# Patient Record
Sex: Male | Born: 1950 | Race: Black or African American | Hispanic: No | Smoking: Current every day smoker
Health system: Southern US, Community
[De-identification: ages and names within clinical notes are randomized; demographics above are authoritative.]

## PROBLEM LIST (undated history)

## (undated) ENCOUNTER — Ambulatory Visit (HOSPITAL_COMMUNITY)

## (undated) DIAGNOSIS — I10 Essential (primary) hypertension: Secondary | ICD-10-CM

## (undated) DIAGNOSIS — R55 Syncope and collapse: Secondary | ICD-10-CM

## (undated) HISTORY — DX: Syncope and collapse: R55

## (undated) HISTORY — DX: Essential (primary) hypertension: I10

---

## 2015-07-29 ENCOUNTER — Ambulatory Visit: Payer: Self-pay | Admitting: Family Medicine

## 2018-04-13 ENCOUNTER — Ambulatory Visit: Payer: Self-pay | Admitting: Family Medicine

## 2019-10-11 ENCOUNTER — Other Ambulatory Visit: Payer: Self-pay

## 2019-10-11 ENCOUNTER — Inpatient Hospital Stay
Admission: EM | Admit: 2019-10-11 | Discharge: 2019-10-13 | DRG: 292 | Disposition: A | Discharge status: Home / Self Care | Payer: No Typology Code available for payment source | Attending: Internal Medicine | Admitting: Internal Medicine

## 2019-10-11 ENCOUNTER — Observation Stay: Payer: No Typology Code available for payment source

## 2019-10-11 ENCOUNTER — Emergency Department: Payer: No Typology Code available for payment source

## 2019-10-11 DIAGNOSIS — I491 Atrial premature depolarization: Secondary | ICD-10-CM | POA: Diagnosis present

## 2019-10-11 DIAGNOSIS — D72829 Elevated white blood cell count, unspecified: Secondary | ICD-10-CM | POA: Diagnosis not present

## 2019-10-11 DIAGNOSIS — R55 Syncope and collapse: Secondary | ICD-10-CM | POA: Diagnosis present

## 2019-10-11 DIAGNOSIS — S0990XA Unspecified injury of head, initial encounter: Secondary | ICD-10-CM | POA: Diagnosis not present

## 2019-10-11 DIAGNOSIS — W1830XA Fall on same level, unspecified, initial encounter: Secondary | ICD-10-CM | POA: Diagnosis present

## 2019-10-11 DIAGNOSIS — R17 Unspecified jaundice: Secondary | ICD-10-CM | POA: Diagnosis not present

## 2019-10-11 DIAGNOSIS — Z794 Long term (current) use of insulin: Secondary | ICD-10-CM

## 2019-10-11 DIAGNOSIS — I440 Atrioventricular block, first degree: Secondary | ICD-10-CM | POA: Diagnosis present

## 2019-10-11 DIAGNOSIS — E876 Hypokalemia: Secondary | ICD-10-CM

## 2019-10-11 DIAGNOSIS — I110 Hypertensive heart disease with heart failure: Secondary | ICD-10-CM | POA: Diagnosis not present

## 2019-10-11 DIAGNOSIS — I5032 Chronic diastolic (congestive) heart failure: Secondary | ICD-10-CM | POA: Diagnosis present

## 2019-10-11 DIAGNOSIS — W19XXXA Unspecified fall, initial encounter: Secondary | ICD-10-CM | POA: Diagnosis present

## 2019-10-11 DIAGNOSIS — N179 Acute kidney failure, unspecified: Secondary | ICD-10-CM | POA: Diagnosis present

## 2019-10-11 DIAGNOSIS — S0101XA Laceration without foreign body of scalp, initial encounter: Secondary | ICD-10-CM | POA: Diagnosis present

## 2019-10-11 DIAGNOSIS — M4802 Spinal stenosis, cervical region: Secondary | ICD-10-CM | POA: Diagnosis present

## 2019-10-11 DIAGNOSIS — Z8249 Family history of ischemic heart disease and other diseases of the circulatory system: Secondary | ICD-10-CM

## 2019-10-11 DIAGNOSIS — Z20822 Contact with and (suspected) exposure to covid-19: Secondary | ICD-10-CM | POA: Diagnosis present

## 2019-10-11 DIAGNOSIS — E1165 Type 2 diabetes mellitus with hyperglycemia: Secondary | ICD-10-CM | POA: Diagnosis present

## 2019-10-11 LAB — COMPREHENSIVE METABOLIC PANEL
ALT: 20 U/L (ref 0–44)
AST: 23 U/L (ref 15–41)
Albumin: 4.5 g/dL (ref 3.5–5.0)
Alkaline Phosphatase: 60 U/L (ref 38–126)
Anion gap: 13 (ref 5–15)
BUN: 21 mg/dL (ref 8–23)
CO2: 26 mmol/L (ref 22–32)
Calcium: 9.4 mg/dL (ref 8.9–10.3)
Chloride: 98 mmol/L (ref 98–111)
Creatinine, Ser: 1.37 mg/dL — ABNORMAL HIGH (ref 0.61–1.24)
GFR calc Af Amer: 60 mL/min — ABNORMAL LOW (ref >60)
GFR calc non Af Amer: 52 mL/min — ABNORMAL LOW (ref >60)
Glucose, Bld: 201 mg/dL — ABNORMAL HIGH (ref 70–99)
Potassium: 3.2 mmol/L — ABNORMAL LOW (ref 3.5–5.1)
Sodium: 137 mmol/L (ref 135–145)
Total Bilirubin: 1.7 mg/dL — ABNORMAL HIGH (ref 0.3–1.2)
Total Protein: 7.7 g/dL (ref 6.5–8.1)

## 2019-10-11 LAB — CBC WITH DIFFERENTIAL/PLATELET
Abs Immature Granulocytes: 0.08 10*3/uL — ABNORMAL HIGH (ref 0.00–0.07)
Basophils Absolute: 0.1 10*3/uL (ref 0.0–0.1)
Basophils Relative: 0 %
Eosinophils Absolute: 0 10*3/uL (ref 0.0–0.5)
Eosinophils Relative: 0 %
HCT: 47.8 % (ref 39.0–52.0)
Hemoglobin: 15.9 g/dL (ref 13.0–17.0)
Immature Granulocytes: 1 %
Lymphocytes Relative: 9 %
Lymphs Abs: 1.3 10*3/uL (ref 0.7–4.0)
MCH: 30.9 pg (ref 26.0–34.0)
MCHC: 33.3 g/dL (ref 30.0–36.0)
MCV: 93 fL (ref 80.0–100.0)
Monocytes Absolute: 1.2 10*3/uL — ABNORMAL HIGH (ref 0.1–1.0)
Monocytes Relative: 8 %
Neutro Abs: 12.7 10*3/uL — ABNORMAL HIGH (ref 1.7–7.7)
Neutrophils Relative %: 82 %
Platelets: 298 10*3/uL (ref 150–400)
RBC: 5.14 MIL/uL (ref 4.22–5.81)
RDW: 14.1 % (ref 11.5–15.5)
WBC: 15.3 10*3/uL — ABNORMAL HIGH (ref 4.0–10.5)
nRBC: 0 % (ref 0.0–0.2)

## 2019-10-11 LAB — TROPONIN I (HIGH SENSITIVITY): Troponin I (High Sensitivity): 24 ng/L — ABNORMAL HIGH (ref <18)

## 2019-10-11 LAB — RESPIRATORY PANEL BY RT PCR (FLU A&B, COVID)
Influenza A by PCR: NEGATIVE
Influenza B by PCR: NEGATIVE
SARS Coronavirus 2 by RT PCR: NEGATIVE

## 2019-10-11 MED ORDER — ENOXAPARIN SODIUM 40 MG/0.4ML ~~LOC~~ SOLN
40.0000 mg | SUBCUTANEOUS | Status: DC
  Administered 2019-10-11 – 2019-10-12 (×2): 40 mg via SUBCUTANEOUS
  Filled 2019-10-11 (×2): qty 0.4

## 2019-10-11 MED ORDER — MAGNESIUM HYDROXIDE 400 MG/5ML PO SUSP: 30.0000 mL | Freq: Every day | ORAL | Status: DC | PRN

## 2019-10-11 MED ORDER — TRAZODONE HCL 50 MG PO TABS
25.0000 mg | ORAL_TABLET | Freq: Every evening | ORAL | Status: DC | PRN
  Administered 2019-10-12: 25 mg via ORAL
  Filled 2019-10-11: qty 1

## 2019-10-11 MED ORDER — POTASSIUM CHLORIDE 20 MEQ PO PACK
40.0000 meq | PACK | Freq: Once | ORAL | Status: AC
  Administered 2019-10-12: 40 meq via ORAL
  Filled 2019-10-11: qty 2

## 2019-10-11 MED ORDER — ACETAMINOPHEN 650 MG RE SUPP: 650.0000 mg | Freq: Four times a day (QID) | RECTAL | Status: DC | PRN

## 2019-10-11 MED ORDER — ONDANSETRON HCL 4 MG PO TABS: 4.0000 mg | ORAL_TABLET | Freq: Four times a day (QID) | ORAL | Status: DC | PRN

## 2019-10-11 MED ORDER — SODIUM CHLORIDE 0.9% FLUSH
3.0000 mL | Freq: Two times a day (BID) | INTRAVENOUS | Status: DC
  Administered 2019-10-12 – 2019-10-13 (×3): 3 mL via INTRAVENOUS

## 2019-10-11 MED ORDER — LIDOCAINE HCL (PF) 1 % IJ SOLN
INTRAMUSCULAR | Status: AC
  Administered 2019-10-11: 5 mL via INTRADERMAL
  Filled 2019-10-11: qty 5

## 2019-10-11 MED ORDER — LIDOCAINE HCL (PF) 1 % IJ SOLN: 5.0000 mL | Freq: Once | INTRAMUSCULAR | Status: AC

## 2019-10-11 MED ORDER — ALUM & MAG HYDROXIDE-SIMETH 200-200-20 MG/5ML PO SUSP: 30.0000 mL | Freq: Four times a day (QID) | ORAL | Status: DC | PRN

## 2019-10-11 MED ORDER — ONDANSETRON HCL 4 MG/2ML IJ SOLN: 4.0000 mg | Freq: Four times a day (QID) | INTRAMUSCULAR | Status: DC | PRN

## 2019-10-11 MED ORDER — SODIUM CHLORIDE 0.9 % IV SOLN: INTRAVENOUS | Status: DC

## 2019-10-11 MED ORDER — INSULIN ASPART 100 UNIT/ML ~~LOC~~ SOLN
0.0000 [IU] | Freq: Three times a day (TID) | SUBCUTANEOUS | Status: DC
  Administered 2019-10-12 (×2): 2 [IU] via SUBCUTANEOUS
  Administered 2019-10-12 – 2019-10-13 (×2): 1 [IU] via SUBCUTANEOUS
  Filled 2019-10-11 (×4): qty 1

## 2019-10-11 MED ORDER — ACETAMINOPHEN 325 MG PO TABS
650.0000 mg | ORAL_TABLET | Freq: Four times a day (QID) | ORAL | Status: DC | PRN
  Administered 2019-10-11: 650 mg via ORAL
  Filled 2019-10-11: qty 2

## 2019-10-11 NOTE — ED Notes (Signed)
IV access attempted by 2 RNs, IV team paged at this time

## 2019-10-11 NOTE — ED Notes (Signed)
Pt reports dizziness has been relieved

## 2019-10-11 NOTE — ED Provider Notes (Signed)
[facility] Emergency Department Provider Note ____________________________________________   First MD Initiated Contact with Patient 10/11/19 2040     (approximate)  I have reviewed the triage vital signs and the nursing notes.   HISTORY  Chief Complaint Loss of Consciousness    HPI Juan Shelton is a 69 y.o. 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  He states that he very briefly felt lightheaded, but did not have enough time to sit down.  He hit the back of his head when he fell.  He states that he feels a bit tired but otherwise relatively well.  He had one recurrent episode of near syncope while waiting in the ED, associated with lightheadedness and sweating.  He denies any chest pain, palpitations, shortness of breath, fever, vomiting, or other acute symptoms.  He has had 1 prior episode like this a few years ago.  He has hypertension but denies any recent medication changes.  He has no history of heart problems.  Past Medical History:  Diagnosis Date  . Hypertension   . Syncope     Patient Active Problem List   Diagnosis Date Noted  . Syncope 10/11/2019    Prior to Admission medications   Medication Sig Start Date End Date Taking? Authorizing Provider  insulin glargine (LANTUS) 100 UNIT/ML injection Inject 15 Units into the skin at bedtime.   Yes [provider]    Allergies Patient has no known allergies.  No family history on file.  Social History Social History   Tobacco Use  . Smoking status: Not on file  Substance Use Topics  . Alcohol use: Not on file  . Drug use: Not on file    Review of Systems  Constitutional: No fever. Eyes: No redness. ENT: No sore throat. Cardiovascular: Denies chest pain. Respiratory: Denies shortness of breath. Gastrointestinal: No vomiting or diarrhea.  Genitourinary: Negative for  dysuria.  Musculoskeletal: Negative for back pain. Skin: Negative for rash. Neurological: Negative for headache.   ____________________________________________   PHYSICAL EXAM:  VITAL SIGNS: ED Triage Vitals  Enc Vitals Group     BP 10/11/19 1944 119/61     Pulse Rate 10/11/19 1944 88     Resp 10/11/19 1944 18     Temp 10/11/19 1944 97.9 F (36.6 C)     Temp Source 10/11/19 1944 Oral     SpO2 10/11/19 1944 97 %     Weight 10/11/19 1945 220 lb (99.8 kg)     Height 10/11/19 1945 6\' 4"  (1.93 m)     Head Circumference --      Peak Flow --      Pain Score 10/11/19 1945 5     Pain Loc --      Pain Edu? --      Excl. in GC? --     Constitutional: Alert and oriented.  Relatively well appearing and in no acute distress. Eyes: Conjunctivae are normal.  EOMI. Head: 5 cm superficial laceration to the submental area. Nose: No congestion/rhinnorhea. Mouth/Throat: Mucous membranes are moist.   Neck: Normal range of motion.  No cervical spinal tenderness. Cardiovascular: Normal rate, regular rhythm. Grossly normal heart sounds.  Good peripheral circulation. Respiratory: Normal respiratory effort.  No retractions. Lungs CTAB. Gastrointestinal: No distention.  Musculoskeletal: No lower extremity edema.  Extremities warm and well perfused.  Neurologic:  Normal speech and language.  5/5 motor strength and intact sensation to all extremities.  No pronator drift.  Normal coordination. Skin:  Skin is warm and dry. No rash noted. Psychiatric: Mood and affect are normal. Speech and behavior are normal.  ____________________________________________   LABS (all labs ordered are listed, but only abnormal results are displayed)  Labs Reviewed  CBC WITH DIFFERENTIAL/PLATELET - Abnormal; Notable for the following components:      Result Value   WBC 15.3 (*)    Neutro Abs 12.7 (*)    Monocytes Absolute 1.2 (*)    Abs Immature Granulocytes 0.08 (*)    All other components within normal limits   COMPREHENSIVE METABOLIC PANEL - Abnormal; Notable for the following components:   Potassium 3.2 (*)    Glucose, Bld 201 (*)    Creatinine, Ser 1.37 (*)    Total Bilirubin 1.7 (*)    GFR calc non Af Amer 52 (*)    GFR calc Af Amer 60 (*)    All other components within normal limits  TROPONIN I (HIGH SENSITIVITY) - Abnormal; Notable for the following components:   Troponin I (High Sensitivity) 24 (*)    All other components within normal limits  RESPIRATORY PANEL BY RT PCR (FLU A&B, COVID)  URINALYSIS, COMPLETE (UACMP) WITH MICROSCOPIC  BASIC METABOLIC PANEL  CBC  TROPONIN I (HIGH SENSITIVITY)   ____________________________________________  EKG  ED ECG REPORT I, Dionne Bucy, the attending physician, personally viewed and interpreted this ECG.  Date: 10/11/2019 EKG Time: 1941 Rate: 84 Rhythm: normal sinus rhythm with PVCs QRS Axis: normal Intervals: Prolonged PR interval, borderline QT ST/T Wave abnormalities: normal Narrative Interpretation: no evidence of acute ischemia  ____________________________________________  RADIOLOGY  CT head: No ICH  ____________________________________________   PROCEDURES  Procedure(s) performed: Yes  .Marland KitchenLaceration Repair  Date/Time: 10/11/2019 9:55 PM Performed by: Dionne Bucy, MD Authorized by: Dionne Bucy, MD   Consent:    Consent obtained:  Verbal   Consent given by:  Patient   Risks discussed:  Infection, pain, retained foreign body, poor cosmetic result and poor wound healing Anesthesia (see MAR for exact dosages):    Anesthesia method:  Local infiltration   Local anesthetic:  Lidocaine 1% w/o epi Laceration details:    Location:  Scalp   Scalp location:  Occipital   Length (cm):  5 Repair type:    Repair type:  Simple Exploration:    Hemostasis achieved with:  Direct pressure   Wound exploration: entire depth of wound probed and visualized     Contaminated: no   Treatment:    Area cleansed  with:  Betadine   Amount of cleaning:  Standard   Visualized foreign bodies/material removed: no   Skin repair:    Repair method:  Staples   Number of staples:  11 Approximation:    Approximation:  Close Post-procedure details:    Dressing:  Open (no dressing)   Patient tolerance of procedure:  Tolerated well, no immediate complications    Critical Care performed: No ____________________________________________   INITIAL IMPRESSION / ASSESSMENT AND PLAN / ED COURSE  Pertinent labs & imaging results that were available during my care of the patient were reviewed by me and considered in my medical decision making (see chart for details).  69 year old male with a history of hypertension and other PMH as noted above presents with an episode of syncope with a brief prodrome earlier this evening.  He hit his head and sustained an occipital laceration.  I reviewed the past medical  records in epic; the patient has no prior ED visits or admissions here.  On exam he is overall well-appearing.  His vital signs are normal.  Neurologic exam is nonfocal.  Exam is otherwise as described above.  EKG shows some PVCs and slightly prolonged PR interval.  There are no ischemic findings.  Triage RN reported that the patient had a near syncopal episode while waiting and during that time was noted to have bradycardia, with his heart rate at one point as low as the 20s.  During my exam, the heart rate was in the 60s to 70s.  Differential includes vasovagal syncope or other benign etiology, versus possible symptomatic bradycardia, other arrhythmia, or less likely ACS or other cardiac etiology.  We will obtain lab work-up, CT head, perform laceration repair, and reassess.  I recommend admission given the potential symptomatic bradycardia with syncope.  ----------------------------------------- 10:20 PM on 10/11/2019 -----------------------------------------  I discussed the case with Dr. Arville Care from the  hospitalist service for admission.  ____________________________________________   FINAL CLINICAL IMPRESSION(S) / ED DIAGNOSES  Final diagnoses:  Syncope, unspecified syncope type      NEW MEDICATIONS STARTED DURING THIS VISIT:  New Prescriptions   No medications on file     Note:  This document was prepared using Dragon voice recognition software and may include unintentional dictation errors.    Dionne Bucy, MD 10/11/19 2221

## 2019-10-11 NOTE — ED Notes (Signed)
First Nurse Note: Pt to ED via [facility]

## 2019-10-11 NOTE — ED Notes (Signed)
Pt had a fall at home with positive LOC. Pt is having nausea and dizziness. Pt is not on blood thinners.  Pt given Zofran 4 mg IM

## 2019-10-11 NOTE — H&P (Addendum)
[place]   PATIENT NAME: Juan Shelton    MR#:  [id number]  DATE OF BIRTH:  1950-05-07  DATE OF ADMISSION:  10/11/2019  PRIMARY CARE PHYSICIAN: Patient, No Pcp Per   REQUESTING/REFERRING PHYSICIAN: Dionne Bucy, MD  CHIEF COMPLAINT:   Chief Complaint  Patient presents with  . Loss of Consciousness    HISTORY OF PRESENT ILLNESS:  Juan Shelton  is a 69 y.o. male with a known history of syncope and hypertension and type II diabetes mellitus presented to the emergency room with acute onset of syncope.  He stated that he had lightheadedness and a fall before was able to sit hitting his occipital area and sustaining a hematoma and laceration.  In the waiting room he had another episode however of lightheadedness with diaphoresis and presyncope without loss of consciousness.  He was then noted to be bradycardic during this presyncope with a heart rate that reportedly went down to the 20s.  He had a similar episode of syncope few years ago.  He denied any chest pain or dyspnea or palpitations.  No cough or wheezing or hemoptysis.  No fever or chills.  No nausea or vomiting or abdominal pain.  Upon presentation to the emergency room, vital signs were within normal.  Labs revealed hypokalemia of 3.2 and hyperglycemia of 2 1 with creatinine 1.37 total bili 1.7 and otherwise normal LFTs.  CBC showed leukocytosis of 15.3 with neutrophilia.  Initial troponin I was 24.  Respiratory panel is currently pending.  Head CT without contrast revealed signs of atrophy and laceration and small hematoma in the right paramedian occipital area with overlying dressing.  EKG showed sinus rhythm with a rate of 84 with first-degree AV block and premature supraventricular complexes with nonspecific T wave changes anterolaterally and inferiorly and prolonged QT interval with QTC of 470 MS.  The patient will be admitted to an observation medical monitored bed for further evaluation and management. PAST MEDICAL  HISTORY:   Past Medical History:  Diagnosis Date  . Hypertension   . Syncope   Type 2 diabetes mellitus Tobacco abuse PAST SURGICAL HISTORY:  He denied any previous surgeries.  SOCIAL HISTORY:   Social History   Tobacco Use  . Smoking status: Not on file  Substance Use Topics  . Alcohol use: Not on file    FAMILY HISTORY:  His father died from MI.  DRUG ALLERGIES:  No Known Allergies  REVIEW OF SYSTEMS:   ROS As per history of present illness. All pertinent systems were reviewed above. Constitutional, HEENT, cardiovascular, respiratory, GI, GU, musculoskeletal, neuro, psychiatric, endocrine, integumentary and hematologic systems were reviewed and are otherwise negative/unremarkable except for positive findings mentioned above in the HPI.   MEDICATIONS AT HOME:   Prior to Admission medications   Not on File      VITAL SIGNS:  Blood pressure 130/76, pulse 76, temperature 97.9 F (36.6 C), temperature source Oral, resp. rate 18, height 6\' 4"  (1.93 m), weight 99.8 kg, SpO2 97 %.  PHYSICAL EXAMINATION:  Physical Exam  GENERAL:  69 y.o.-year-old African-American male patient lying in the bed with no acute distress.  EYES: Pupils equal, round, reactive to light and accommodation. No scleral icterus. Extraocular muscles intact.  HEENT: Head with occipital laceration and intact 11 staples with good hemostasis and surrounding hematoma.  Normocephalic. Oropharynx and nasopharynx clear.  NECK:  Supple, no jugular venous distention. No thyroid enlargement, no tenderness.  LUNGS: Normal breath sounds bilaterally, no wheezing, rales,rhonchi  or crepitation. No use of accessory muscles of respiration.  CARDIOVASCULAR: Regular rate and rhythm, S1, S2 normal. No murmurs, rubs, or gallops.  ABDOMEN: Soft, nondistended, nontender. Bowel sounds present. No organomegaly or mass.  EXTREMITIES: No pedal edema, cyanosis, or clubbing.  NEUROLOGIC: Cranial nerves II through XII are intact.  Muscle strength 5/5 in all extremities. Sensation intact. Gait not checked.  PSYCHIATRIC: The patient is alert and oriented x 3.  Normal affect and good eye contact. SKIN: No obvious rash, lesion, or ulcer.   LABORATORY PANEL:   CBC Recent Labs  Lab 10/11/19 1958  WBC 15.3*  HGB 15.9  HCT 47.8  PLT 298   ------------------------------------------------------------------------------------------------------------------  Chemistries  Recent Labs  Lab 10/11/19 1958  NA 137  K 3.2*  CL 98  CO2 26  GLUCOSE 201*  BUN 21  CREATININE 1.37*  CALCIUM 9.4  AST 23  ALT 20  ALKPHOS 60  BILITOT 1.7*   ------------------------------------------------------------------------------------------------------------------  Cardiac Enzymes No results for input(s): TROPONINI in the last 168 hours. ------------------------------------------------------------------------------------------------------------------  RADIOLOGY:  CT Head Wo Contrast  Result Date: 10/11/2019 CLINICAL DATA:  Head trauma, syncope EXAM: CT HEAD WITHOUT CONTRAST TECHNIQUE: Contiguous axial images were obtained from the base of the skull through the vertex without intravenous contrast. COMPARISON:  None FINDINGS: Brain: No evidence of acute infarction, hemorrhage, hydrocephalus, extra-axial collection or mass lesion/mass effect. Signs of atrophy. Vascular: No hyperdense vessel or unexpected calcification. Skull: Normal. Negative for fracture or focal lesion. Sinuses/Orbits: No acute finding. Other: Laceration with small scalp hematoma in the RIGHT paramidline occipital region with overlying dressing material IMPRESSION: 1. No acute intracranial pathology. 2. Signs of atrophy. 3. Laceration with small scalp hematoma in the RIGHT paramidline occipital region with overlying dressing material. Electronically Signed   By: Donzetta Kohut M.D.   On: 10/11/2019 20:15      IMPRESSION AND PLAN:   1.  Syncope with subsequent head  injury and occipital hematoma and laceration. Differential diagnosis would include symptomatic bradycardia, neurally mediated syncope, cardiogenic, other arrhythmias related,  orthostatic hypotension and less likely hypoglycemia.  He has elevated troponin I. -The patient will be admitted to an observation medically monitored bed.   -Will check orthostatics q 12 hours.   -Will obtain a bilateral carotid Doppler and 2D echo. - The patient will be gently hydrated with IV normal saline and monitored for arrhythmias. -We will follow serial troponin Is. -Given his episode of bradycardia and slightly elevated troponin I will obtain cardiology consultation in a.m. -I notified Dr. Welton Flakes about the patient.  2.  Hypokalemia. -Potassium will be replaced and magnesium level will be checked.  3.  Type 2 diabetes mellitus with mild hyperglycemia. -We will obtain hemoglobin A1c and monitor fingerstick glucose measures with supplement coverage with NovoLog.  4.  Leukocytosis.  It could be secondary to stress demargination especially given his injury. -We will obtain urinalysis.  5.  Ongoing tobacco abuse. -I counseled him for smoking cessation and he will receive further counseling here.  6.  DVT prophylaxis. -Subcutaneous Lovenox.   All the records are reviewed and case discussed with ED provider. The plan of care was discussed in details with the patient (and family). I answered all questions. The patient agreed to proceed with the above mentioned plan. Further management will depend upon hospital course.   CODE STATUS: Full code  Status is: Observation  The patient remains OBS appropriate and will d/c before 2 midnights.  Dispo: The patient is from: Home  Anticipated d/c is to: Home              Anticipated d/c date is: 1 day              Patient currently is not medically stable to d/c.    TOTAL TIME TAKING CARE OF THIS PATIENT: 55 minutes.    Hannah Beat M.D on 10/11/2019  at 10:03 PM  [facility]   From 7 PM-7 AM, contact night-coverage [email or web address]  CC: Primary care physician; Patient, No Pcp Per

## 2019-10-11 NOTE — ED Notes (Signed)
Assumed care of pt upon being roomed. C/o dizziness at this time, denies head pain. +LOC with fall onto cement. Lac with bleeding controlled by gauze to posterior inferior head. Wife at bedside. Awaiting provider eval and troponin results at this time. Pt provided with warm blanket. AO x4. Talking in full sentences with regular and unlabored breathing.

## 2019-10-11 NOTE — ED Triage Notes (Signed)
Pt in with co syncope hx of the same. States unsure of cause, no recent illness. PT states he fell against the wall hitting back of head. Lac noted to area with bleeding controlled. No other injuries from fall at this time.

## 2019-10-12 ENCOUNTER — Inpatient Hospital Stay (HOSPITAL_COMMUNITY)
Admit: 2019-10-12 | Discharge: 2019-10-12 | Disposition: A | Discharge status: Home / Self Care | Payer: No Typology Code available for payment source | Attending: Family Medicine | Admitting: Family Medicine

## 2019-10-12 ENCOUNTER — Encounter: Payer: Self-pay | Admitting: Internal Medicine

## 2019-10-12 ENCOUNTER — Observation Stay: Payer: No Typology Code available for payment source

## 2019-10-12 DIAGNOSIS — R55 Syncope and collapse: Secondary | ICD-10-CM | POA: Diagnosis present

## 2019-10-12 DIAGNOSIS — S0101XA Laceration without foreign body of scalp, initial encounter: Secondary | ICD-10-CM | POA: Diagnosis present

## 2019-10-12 DIAGNOSIS — Z8249 Family history of ischemic heart disease and other diseases of the circulatory system: Secondary | ICD-10-CM | POA: Diagnosis not present

## 2019-10-12 DIAGNOSIS — I340 Nonrheumatic mitral (valve) insufficiency: Secondary | ICD-10-CM

## 2019-10-12 DIAGNOSIS — I361 Nonrheumatic tricuspid (valve) insufficiency: Secondary | ICD-10-CM | POA: Diagnosis not present

## 2019-10-12 DIAGNOSIS — I110 Hypertensive heart disease with heart failure: Secondary | ICD-10-CM | POA: Diagnosis present

## 2019-10-12 DIAGNOSIS — M4802 Spinal stenosis, cervical region: Secondary | ICD-10-CM | POA: Diagnosis present

## 2019-10-12 DIAGNOSIS — Z20822 Contact with and (suspected) exposure to covid-19: Secondary | ICD-10-CM | POA: Diagnosis present

## 2019-10-12 DIAGNOSIS — E1165 Type 2 diabetes mellitus with hyperglycemia: Secondary | ICD-10-CM | POA: Diagnosis present

## 2019-10-12 DIAGNOSIS — Z794 Long term (current) use of insulin: Secondary | ICD-10-CM | POA: Diagnosis not present

## 2019-10-12 DIAGNOSIS — N179 Acute kidney failure, unspecified: Secondary | ICD-10-CM | POA: Diagnosis present

## 2019-10-12 DIAGNOSIS — E876 Hypokalemia: Secondary | ICD-10-CM | POA: Diagnosis present

## 2019-10-12 DIAGNOSIS — W1830XA Fall on same level, unspecified, initial encounter: Secondary | ICD-10-CM | POA: Diagnosis present

## 2019-10-12 DIAGNOSIS — R17 Unspecified jaundice: Secondary | ICD-10-CM | POA: Diagnosis not present

## 2019-10-12 DIAGNOSIS — I491 Atrial premature depolarization: Secondary | ICD-10-CM | POA: Diagnosis present

## 2019-10-12 DIAGNOSIS — W19XXXA Unspecified fall, initial encounter: Secondary | ICD-10-CM | POA: Diagnosis present

## 2019-10-12 DIAGNOSIS — I5032 Chronic diastolic (congestive) heart failure: Secondary | ICD-10-CM | POA: Diagnosis present

## 2019-10-12 DIAGNOSIS — I440 Atrioventricular block, first degree: Secondary | ICD-10-CM | POA: Diagnosis present

## 2019-10-12 DIAGNOSIS — D72829 Elevated white blood cell count, unspecified: Secondary | ICD-10-CM | POA: Diagnosis not present

## 2019-10-12 LAB — COMPREHENSIVE METABOLIC PANEL
ALT: 19 U/L (ref 0–44)
AST: 21 U/L (ref 15–41)
Albumin: 4.1 g/dL (ref 3.5–5.0)
Alkaline Phosphatase: 57 U/L (ref 38–126)
Anion gap: 10 (ref 5–15)
BUN: 20 mg/dL (ref 8–23)
CO2: 25 mmol/L (ref 22–32)
Calcium: 8.9 mg/dL (ref 8.9–10.3)
Chloride: 102 mmol/L (ref 98–111)
Creatinine, Ser: 1.04 mg/dL (ref 0.61–1.24)
GFR calc Af Amer: >60 mL/min (ref >60)
GFR calc non Af Amer: >60 mL/min (ref >60)
Glucose, Bld: 147 mg/dL — ABNORMAL HIGH (ref 70–99)
Potassium: 3.8 mmol/L (ref 3.5–5.1)
Sodium: 137 mmol/L (ref 135–145)
Total Bilirubin: 1.9 mg/dL — ABNORMAL HIGH (ref 0.3–1.2)
Total Protein: 7.2 g/dL (ref 6.5–8.1)

## 2019-10-12 LAB — URINALYSIS, COMPLETE (UACMP) WITH MICROSCOPIC
Bacteria, UA: NONE SEEN
Bilirubin Urine: NEGATIVE
Glucose, UA: NEGATIVE mg/dL
Ketones, ur: 20 mg/dL — AB
Nitrite: NEGATIVE
Protein, ur: 30 mg/dL — AB
Specific Gravity, Urine: 1.029 (ref 1.005–1.030)
pH: 5 (ref 5.0–8.0)

## 2019-10-12 LAB — GLUCOSE, CAPILLARY
Glucose-Capillary: 145 mg/dL — ABNORMAL HIGH (ref 70–99)
Glucose-Capillary: 157 mg/dL — ABNORMAL HIGH (ref 70–99)
Glucose-Capillary: 168 mg/dL — ABNORMAL HIGH (ref 70–99)
Glucose-Capillary: 136 mg/dL — ABNORMAL HIGH (ref 70–99)
Glucose-Capillary: 126 mg/dL — ABNORMAL HIGH (ref 70–99)

## 2019-10-12 LAB — TROPONIN I (HIGH SENSITIVITY): Troponin I (High Sensitivity): 30 ng/L — ABNORMAL HIGH (ref <18)

## 2019-10-12 LAB — HEMOGLOBIN A1C
Hgb A1c MFr Bld: 6.4 % — ABNORMAL HIGH (ref 4.8–5.6)
Mean Plasma Glucose: 136.98 mg/dL

## 2019-10-12 LAB — ECHOCARDIOGRAM COMPLETE
AR max vel: 2.3 cm2
AV Peak grad: 13 mmHg
Ao pk vel: 1.8 m/s
Area-P 1/2: 2.74 cm2
Height: 76 in
S' Lateral: 3.54 cm
Weight: 3520 oz

## 2019-10-12 LAB — CBC WITH DIFFERENTIAL/PLATELET
Abs Immature Granulocytes: 0.02 10*3/uL (ref 0.00–0.07)
Basophils Absolute: 0 10*3/uL (ref 0.0–0.1)
Basophils Relative: 0 %
Eosinophils Absolute: 0 10*3/uL (ref 0.0–0.5)
Eosinophils Relative: 0 %
HCT: 44 % (ref 39.0–52.0)
Hemoglobin: 14.9 g/dL (ref 13.0–17.0)
Immature Granulocytes: 0 %
Lymphocytes Relative: 7 %
Lymphs Abs: 0.8 10*3/uL (ref 0.7–4.0)
MCH: 31.2 pg (ref 26.0–34.0)
MCHC: 33.9 g/dL (ref 30.0–36.0)
MCV: 92.1 fL (ref 80.0–100.0)
Monocytes Absolute: 0.9 10*3/uL (ref 0.1–1.0)
Monocytes Relative: 8 %
Neutro Abs: 8.9 10*3/uL — ABNORMAL HIGH (ref 1.7–7.7)
Neutrophils Relative %: 85 %
Platelets: 273 10*3/uL (ref 150–400)
RBC: 4.78 MIL/uL (ref 4.22–5.81)
RDW: 13.9 % (ref 11.5–15.5)
WBC: 10.6 10*3/uL — ABNORMAL HIGH (ref 4.0–10.5)
nRBC: 0 % (ref 0.0–0.2)

## 2019-10-12 LAB — MAGNESIUM: Magnesium: 1.9 mg/dL (ref 1.7–2.4)

## 2019-10-12 LAB — PHOSPHORUS: Phosphorus: 2.3 mg/dL — ABNORMAL LOW (ref 2.5–4.6)

## 2019-10-12 MED ORDER — GABAPENTIN 300 MG PO CAPS
300.0000 mg | ORAL_CAPSULE | Freq: Every day | ORAL | Status: DC
  Administered 2019-10-12: 300 mg via ORAL
  Filled 2019-10-12: qty 1

## 2019-10-12 MED ORDER — DIPHENHYDRAMINE HCL 25 MG PO CAPS
25.0000 mg | ORAL_CAPSULE | Freq: Four times a day (QID) | ORAL | Status: DC | PRN
  Administered 2019-10-12 – 2019-10-13 (×2): 25 mg via ORAL
  Filled 2019-10-12 (×2): qty 1

## 2019-10-12 MED ORDER — INSULIN GLARGINE 100 UNIT/ML ~~LOC~~ SOLN: 15.0000 [IU] | Freq: Every day | SUBCUTANEOUS | Status: DC

## 2019-10-12 MED ORDER — INSULIN GLARGINE 100 UNIT/ML ~~LOC~~ SOLN
10.0000 [IU] | Freq: Every day | SUBCUTANEOUS | Status: DC
  Administered 2019-10-12: 10 [IU] via SUBCUTANEOUS
  Filled 2019-10-12 (×2): qty 0.1

## 2019-10-12 MED ORDER — MAGNESIUM SULFATE 2 GM/50ML IV SOLN
2.0000 g | Freq: Once | INTRAVENOUS | Status: AC
  Administered 2019-10-12: 2 g via INTRAVENOUS
  Filled 2019-10-12: qty 50

## 2019-10-12 MED ORDER — POTASSIUM PHOSPHATES 15 MMOLE/5ML IV SOLN
10.0000 mmol | Freq: Once | INTRAVENOUS | Status: AC
  Administered 2019-10-12: 10 mmol via INTRAVENOUS
  Filled 2019-10-12: qty 3.33

## 2019-10-12 NOTE — Progress Notes (Signed)
PT Cancellation Note  Patient Details Name: Juan Shelton MRN: [id number] DOB: [birth year]   Cancelled Treatment:    Reason Eval/Treat Not Completed: Patient not medically ready.  PT consult received.  Chart reviewed. Pt noted with troponin up-trending from 24 to 30.  D/t up-trending troponin, will hold PT at this time, monitor pt's status, and re-attempt PT eval at a later date/time as medically appropriate.  Hendricks Limes, PT 10/12/19, 9:26 AM

## 2019-10-12 NOTE — Progress Notes (Signed)
*  PRELIMINARY RESULTS* Echocardiogram 2D Echocardiogram has been performed.  Juan Shelton 10/12/2019, 4:27 PM

## 2019-10-12 NOTE — Consult Note (Signed)
Juan Shelton is a 69 y.o. male  [id number]  Primary Cardiologist: Adrian Blackwater Reason for Consultation: Syncope  HPI: This is a 69 year old male patient who presented to the hospital with episodes of syncope and shortness of breath.   Review of Systems: No chest pain   Past Medical History:  Diagnosis Date  . Hypertension   . Syncope     (Not in a hospital admission)    . enoxaparin (LOVENOX) injection  40 mg Subcutaneous Q24H  . insulin aspart  0-9 Units Subcutaneous TID PC & HS  . sodium chloride flush  3 mL Intravenous Q12H    Infusions: . sodium chloride 100 mL/hr at 10/12/19 0123    No Known Allergies  Social History   Socioeconomic History  . Marital status: Divorced    Spouse name: Not on file  . Number of children: Not on file  . Years of education: Not on file  . Highest education level: Not on file  Occupational History  . Not on file  Tobacco Use  . Smoking status: Not on file  Substance and Sexual Activity  . Alcohol use: Not on file  . Drug use: Not on file  . Sexual activity: Not on file  Other Topics Concern  . Not on file  Social History Narrative  . Not on file   Social Determinants of Health   Financial Resource Strain:   . Difficulty of Paying Living Expenses: Not on file  Food Insecurity:   . Worried About Programme researcher, broadcasting/film/video in the Last Year: Not on file  . Ran Out of Food in the Last Year: Not on file  Transportation Needs:   . Lack of Transportation (Medical): Not on file  . Lack of Transportation (Non-Medical): Not on file  Physical Activity:   . Days of Exercise per Week: Not on file  . Minutes of Exercise per Session: Not on file  Stress:   . Feeling of Stress : Not on file  Social Connections:   . Frequency of Communication with Friends and Family: Not on file  . Frequency of Social Gatherings with Friends and Family: Not on file  . Attends Religious Services: Not on file  . Active Member of Clubs or  Organizations: Not on file  . Attends Banker Meetings: Not on file  . Marital Status: Not on file  Intimate Partner Violence:   . Fear of Current or Ex-Partner: Not on file  . Emotionally Abused: Not on file  . Physically Abused: Not on file  . Sexually Abused: Not on file    No family history on file.  PHYSICAL EXAM: Vitals:   10/12/19 0600 10/12/19 0630  BP: 140/81 127/80  Pulse: 73 75  Resp: 14 (!) 21  Temp:    SpO2: 96% 95%    No intake or output data in the 24 hours ending 10/12/19 0846  General:  Well appearing. No respiratory difficulty HEENT: normal Neck: supple. no JVD. Carotids 2+ bilat; no bruits. No lymphadenopathy or thryomegaly appreciated. Cor: PMI nondisplaced. Regular rate & rhythm. No rubs, gallops or murmurs. Lungs: clear Abdomen: soft, nontender, nondistended. No hepatosplenomegaly. No bruits or masses. Good bowel sounds. Extremities: no cyanosis, clubbing, rash, edema Neuro: alert & oriented x 3, cranial nerves grossly intact. moves all 4 extremities w/o difficulty. Affect pleasant.  ECG: Normal sinus rhythm with prolonged QT interval.  Results for orders placed or performed during the hospital encounter of 10/11/19 (from the past  24 hour(s))  CBC with Differential     Status: Abnormal   Collection Time: 10/11/19  7:58 PM  Result Value Ref Range   WBC 15.3 (H) 4.0 - 10.5 K/uL   RBC 5.14 4.22 - 5.81 MIL/uL   Hemoglobin 15.9 13.0 - 17.0 g/dL   HCT 40.947.8 39 - 52 %   MCV 93.0 80.0 - 100.0 fL   MCH 30.9 26.0 - 34.0 pg   MCHC 33.3 30.0 - 36.0 g/dL   RDW 81.114.1 91.411.5 - 78.215.5 %   Platelets 298 150 - 400 K/uL   nRBC 0.0 0.0 - 0.2 %   Neutrophils Relative % 82 %   Neutro Abs 12.7 (H) 1.7 - 7.7 K/uL   Lymphocytes Relative 9 %   Lymphs Abs 1.3 0.7 - 4.0 K/uL   Monocytes Relative 8 %   Monocytes Absolute 1.2 (H) 0 - 1 K/uL   Eosinophils Relative 0 %   Eosinophils Absolute 0.0 0 - 0 K/uL   Basophils Relative 0 %   Basophils Absolute 0.1 0 - 0  K/uL   Immature Granulocytes 1 %   Abs Immature Granulocytes 0.08 (H) 0.00 - 0.07 K/uL  Comprehensive metabolic panel     Status: Abnormal   Collection Time: 10/11/19  7:58 PM  Result Value Ref Range   Sodium 137 135 - 145 mmol/L   Potassium 3.2 (L) 3.5 - 5.1 mmol/L   Chloride 98 98 - 111 mmol/L   CO2 26 22 - 32 mmol/L   Glucose, Bld 201 (H) 70 - 99 mg/dL   BUN 21 8 - 23 mg/dL   Creatinine, Ser 9.561.37 (H) 0.61 - 1.24 mg/dL   Calcium 9.4 8.9 - 21.310.3 mg/dL   Total Protein 7.7 6.5 - 8.1 g/dL   Albumin 4.5 3.5 - 5.0 g/dL   AST 23 15 - 41 U/L   ALT 20 0 - 44 U/L   Alkaline Phosphatase 60 38 - 126 U/L   Total Bilirubin 1.7 (H) 0.3 - 1.2 mg/dL   GFR calc non Af Amer 52 (L) >60 mL/min   GFR calc Af Amer 60 (L) >60 mL/min   Anion gap 13 5 - 15  Troponin I (High Sensitivity)     Status: Abnormal   Collection Time: 10/11/19  7:58 PM  Result Value Ref Range   Troponin I (High Sensitivity) 24 (H) <18 ng/L  Respiratory Panel by RT PCR (Flu A&B, Covid) - Nasopharyngeal Swab     Status: None   Collection Time: 10/11/19  9:18 PM   Specimen: Nasopharyngeal Swab  Result Value Ref Range   SARS Coronavirus 2 by RT PCR NEGATIVE NEGATIVE   Influenza A by PCR NEGATIVE NEGATIVE   Influenza B by PCR NEGATIVE NEGATIVE  Troponin I (High Sensitivity)     Status: Abnormal   Collection Time: 10/12/19 12:21 AM  Result Value Ref Range   Troponin I (High Sensitivity) 30 (H) <18 ng/L  Urinalysis, Complete w Microscopic     Status: Abnormal   Collection Time: 10/12/19 12:21 AM  Result Value Ref Range   Color, Urine AMBER (A) YELLOW   APPearance HAZY (A) CLEAR   Specific Gravity, Urine 1.029 1.005 - 1.030   pH 5.0 5.0 - 8.0   Glucose, UA NEGATIVE NEGATIVE mg/dL   Hgb urine dipstick SMALL (A) NEGATIVE   Bilirubin Urine NEGATIVE NEGATIVE   Ketones, ur 20 (A) NEGATIVE mg/dL   Protein, ur 30 (A) NEGATIVE mg/dL   Nitrite NEGATIVE NEGATIVE  Leukocytes,Ua TRACE (A) NEGATIVE   RBC / HPF 0-5 0 - 5 RBC/hpf    WBC, UA 11-20 0 - 5 WBC/hpf   Bacteria, UA NONE SEEN NONE SEEN   Squamous Epithelial / LPF 0-5 0 - 5   Mucus PRESENT   Magnesium     Status: None   Collection Time: 10/12/19 12:21 AM  Result Value Ref Range   Magnesium 1.9 1.7 - 2.4 mg/dL  Hemoglobin B1D     Status: Abnormal   Collection Time: 10/12/19 12:21 AM  Result Value Ref Range   Hgb A1c MFr Bld 6.4 (H) 4.8 - 5.6 %   Mean Plasma Glucose 136.98 mg/dL  Glucose, capillary     Status: Abnormal   Collection Time: 10/12/19  5:49 AM  Result Value Ref Range   Glucose-Capillary 145 (H) 70 - 99 mg/dL  Glucose, capillary     Status: Abnormal   Collection Time: 10/12/19  8:27 AM  Result Value Ref Range   Glucose-Capillary 157 (H) 70 - 99 mg/dL   CT Head Wo Contrast  Result Date: 10/11/2019 CLINICAL DATA:  Head trauma, syncope EXAM: CT HEAD WITHOUT CONTRAST TECHNIQUE: Contiguous axial images were obtained from the base of the skull through the vertex without intravenous contrast. COMPARISON:  None FINDINGS: Brain: No evidence of acute infarction, hemorrhage, hydrocephalus, extra-axial collection or mass lesion/mass effect. Signs of atrophy. Vascular: No hyperdense vessel or unexpected calcification. Skull: Normal. Negative for fracture or focal lesion. Sinuses/Orbits: No acute finding. Other: Laceration with small scalp hematoma in the RIGHT paramidline occipital region with overlying dressing material IMPRESSION: 1. No acute intracranial pathology. 2. Signs of atrophy. 3. Laceration with small scalp hematoma in the RIGHT paramidline occipital region with overlying dressing material. Electronically Signed   By: Donzetta Kohut M.D.   On: 10/11/2019 20:15   US Carotid Bilateral  Result Date: 10/12/2019 CLINICAL DATA:  69 year old male with history of syncope and dizziness. Pertinent past medical history significant for hypertension, diabetes, and tobacco use. EXAM: BILATERAL CAROTID DUPLEX ULTRASOUND TECHNIQUE: Wallace Cullens scale imaging, color Doppler  and duplex ultrasound were performed of bilateral carotid and vertebral arteries in the neck. COMPARISON:  None. FINDINGS: Criteria: Quantification of carotid stenosis is based on velocity parameters that correlate the residual internal carotid diameter with NASCET-based stenosis levels, using the diameter of the distal internal carotid lumen as the denominator for stenosis measurement. The following velocity measurements were obtained: RIGHT ICA: Peak systolic velocity 98 cm/sec, End diastolic velocity 32 cm/sec CCA: Peak systolic velocity 63 cm/sec SYSTOLIC ICA/CCA RATIO:  1.6 ECA: Peak systolic velocity 73 cm/sec LEFT ICA: Peak systolic velocity 151 cm/sec, End diastolic velocity 43 cm/sec CCA: 62 cm/sec SYSTOLIC ICA/CCA RATIO:  2.4 ECA: 77 cm/sec RIGHT CAROTID ARTERY: No significant atherosclerotic plaque formation. No significant tortuosity. Normal low resistance waveforms. RIGHT VERTEBRAL ARTERY:  Antegrade flow. LEFT CAROTID ARTERY: No significant atherosclerotic plaque formation. Increased velocity of 151 centimeters/second in the distal ICA, likely secondary to mild tortuosity. No significant tortuosity. Normal low resistance waveforms. LEFT VERTEBRAL ARTERY:  Antegrade flow. Upper extremity non-invasive blood pressures: Not obtained. IMPRESSION: 1. Right carotid artery system: Patent without significant atherosclerotic plaque formation. 2. Left carotid artery system: Patent without significant atherosclerotic plaque formation. 3.  Vertebral artery system: Patent with antegrade flow bilaterally. Electronically Signed   By: Marliss Coots MD   On: 10/12/2019 07:49     ASSESSMENT AND PLAN: Syncope with prolonged QT interval and borderline troponin.  Advise giving 2 g of magnesium sulfate  IV.  And observe the patient also get echocardiogram.  Jacquis Paxton A

## 2019-10-12 NOTE — ED Notes (Signed)
Report given to receiving RN.

## 2019-10-12 NOTE — Progress Notes (Signed)
PROGRESS NOTE    Juan Shelton  ZOX:[id number] DOB: Oct 22, 1950 DOA: 10/11/2019 PCP: Patient, No Pcp Per   Brief Narrative: HPI per Juan Shelton on 10/11/19 Juan Shelton  is a 69 y.o. male with a known history of syncope and hypertension and type II diabetes mellitus presented to the emergency room with acute onset of syncope.  He stated that he had lightheadedness and a fall before was able to sit hitting his occipital area and sustaining a hematoma and laceration.  In the waiting room he had another episode however of lightheadedness with diaphoresis and presyncope without loss of consciousness.  He was then noted to be bradycardic during this presyncope with a heart rate that reportedly went down to the 20s.  He had a similar episode of syncope few years ago.  He denied any chest pain or dyspnea or palpitations.  No cough or wheezing or hemoptysis.  No fever or chills.  No nausea or vomiting or abdominal pain.  Upon presentation to the emergency room, vital signs were within normal.  Labs revealed hypokalemia of 3.2 and hyperglycemia of 2 1 with creatinine 1.37 total bili 1.7 and otherwise normal LFTs.  CBC showed leukocytosis of 15.3 with neutrophilia.  Initial troponin I was 24.  Respiratory panel is currently pending.  Head CT without contrast revealed signs of atrophy and laceration and small hematoma in the right paramedian occipital area with overlying dressing.  EKG showed sinus rhythm with a rate of 84 with first-degree AV block and premature supraventricular complexes with nonspecific T wave changes anterolaterally and inferiorly and prolonged QT interval with QTC of 470 MS.  The patient will be admitted to an observation medical monitored bed for further evaluation and management.  **Interim History He was complained of neck pain so a neck CT was done and findings were below.  Cardiology was consulted and recommended echocardiogram which is still pending.  PT OT to still evaluate and treat  but in the interim he was told to continue to get IV fluid maintenance with 100 MLS per hour.  Assessment & Plan:   Active Problems:   Syncope  Syncope and Collapse with subsequent head injury and occipital hematoma and laceration. -Differential diagnosis would include symptomatic bradycardia, neurally mediated syncope, cardiogenic, other arrhythmias related, orthostatic hypotension and less likely hypoglycemia.   -He has a mildly elevated Troponin I at 24 and repeat was 30 -Head CT Done and showed "No acute intracranial pathology. Signs of atrophy. Laceration with small scalp hematoma in the RIGHT paramidline occipital region with overlying dressing material." -Patient was complaining of Neck pain so Neck CT was done and showed "No fracture or spondylolisthesis. Multilevel arthropathy. Impression on exiting nerve roots on the right C3-4 and C4-5 due to bony hypertrophy. Multiple foci of facet arthropathy noted. No frank disc extrusion or high-grade stenosis. There are foci of arterial vascular calcification at several sites." ; I have asked NeuroSurgery if they can review the film and make any additional recommendations about further imaging of if further evaluation is needed -The patient will be admitted to an observation medically monitored bed since he will likely cross 2 midnights and because his echocardiogram and PT OT evaluation still pending will change to inpatient -Will check orthostatics q 12 hours.  -Hold his chlorthalidone 25 mg p.o. daily, lisinopril 20 mg p.o. daily as well as Terazosin 2 mg p.o. nightly currently given his AKI and syncope -Will obtain a bilateral carotid Doppler and 2D echo. ECHO is still pending  to be done -R and L Carotid Dopplers showed "Patent without significant atherosclerotic plaque formation." -The patient will be gently hydrated with IV normal saline at 100 mL/hr and monitored for arrhythmias on Telemetry -We will follow serial troponin Is. -Given his  episode of bradycardia and slightly elevated troponin I will obtain cardiology consultation in a.m. -Check PT/OT evaluation still pending to be done -Cardiology Juan Shelton has evaluated and recommending had a syncope with a prolonged QT interval and advising 2 g of mag sulfate IV which has already been given and observing and as well as obtain an echocardiogram which is still pending  Hypokalemia. -Mild and now improved. -K+ went from 3.2 -> 3.8 -In the setting of his chlorthalidone -Replete yesterday and will replete with IV KPHos 10 mmol today -Continue to Monitor and Replete as Necessary -Repeat CMP in the AM  Hypophosphatemia -Patient's Phos Level was 2.3 -Replete with IV K Phos 10 mmol -Continue to Monitor and Replete as Necessary -Repeat Phos Level in the AM   Hyperbilirubinemia -Mild at 1.7 and slightly worsened to 1.9 -Continue to Monitor and Trend and Repeat CMP in the AM   Type 2 Diabetes Mellitus with mild Hyperglycemia. -Hemoglobin A1c was checked here and is now 6.4 -Patient was placed on sensitive NovoLog/scale insulin AC -We will resume his home Lantus 15 units but reduce to 10 units qHS  AKI -Patient's BUN/creatinine on admission was 21/1.37 and is now improved to 20/1.04 with IV fluid hydration -Avoid his chlorthalidone 25 mg p.o. daily, lisinopril 20 once daily, and Terazosin 2 mg p.o. nightly -Continue with IV fluid hydration with normal saline at 100 mL's per hour for now  -Avoid further nephrotoxic medications, contrast dyes, hypotension and renally dose medications -Repeat CMP in a.m.  Leukocytosis -It could be secondary to stress demargination especially given his injury is a mild dehydration -Patient's WBC went from 15.3 is now 10.6 -Urinalysis was obtained and showed a hazy appearance with amber-colored urine, small hemoglobin, 20 ketones, trace leukocytes, negative nitrites, urine specific gravity 1.029, no urine bacteria seen, present mucus, 0-5 RBCs  per high-power field, 0-5 squamous epithelial cells and 11-20 WBCs  Tobacco Abuse -Smoking cessation counseling given  DVT prophylaxis: Enoxaparin 40 mg subcu every 24 Code Status: FULL CODE  Family Communication: Discussed with family at bedside  Disposition Plan: Pending evaluation by PT OT and clearance by cardiology; also needs echocardiogram done  Status is: Observation  The patient will require care spanning > 2 midnights and should be moved to inpatient because: Ongoing diagnostic testing needed not appropriate for outpatient work up, Unsafe d/c plan and Inpatient level of care appropriate due to severity of illness  Dispo: The patient is from: Home              Anticipated d/c is to: TBD but expect Home              Anticipated d/c date is: 1 day              Patient currently is not medically stable to d/c.  Consultants:   Cardiology Juan Shelton   Procedures:  ECHOCARDIOGRAM pending to be done  Antimicrobials:  Anti-infectives (From admission, onward)   None        Subjective: Seen and examined at bedside he is sitting in the chair about to eat breakfast.  Denies any nausea or vomiting and states that he was panicky yesterday trying to rush home because his dog got out and does not  know if that was what caused him to pass out.  States that this is happened before though.  Also states that when he fell he hurt his neck and hit the concrete and his neck was pending but was not imaged.  No nausea or vomiting.  Also complains of some itching in his extremities.  No other concerns or plans at this time and awaiting for echocardiogram to be done as well as PT and OT to further evaluate and treat.  Objective: Vitals:   10/12/19 0500 10/12/19 0530 10/12/19 0600 10/12/19 0630  BP: (!) 154/98  140/81 127/80  Pulse: 71 65 73 75  Resp: 19 17 14  (!) 21  Temp:      TempSrc:      SpO2: 96% 97% 96% 95%  Weight:      Height:        Intake/Output Summary (Last 24 hours) at  10/12/2019 1409 Last data filed at 10/12/2019 1219 Gross per 24 hour  Intake 50 ml  Output --  Net 50 ml   Filed Weights   10/11/19 1945  Weight: 99.8 kg   Examination: Physical Exam:  Constitutional: WN/WD overweight African-American male currently in NAD and appears calm and comfortable Eyes: Lids and conjunctivae normal, sclerae anicteric  ENMT: External Ears, Nose appear normal. Grossly normal hearing.  Neck: Appears normal, supple, no cervical masses, normal ROM, no appreciable thyromegaly; no JVD Respiratory: Diminished to auscultation bilaterally, no wheezing, rales, rhonchi or crackles. Normal respiratory effort and patient is not tachypenic. No accessory muscle use.  Unlabored breathing Cardiovascular: RRR, no murmurs / rubs / gallops. S1 and S2 auscultated. No extremity edema.  Abdomen: Soft, non-tender, mildly distended. No masses palpated.  Bowel sounds positive.  GU: Deferred. Musculoskeletal: No clubbing / cyanosis of digits/nails. No joint deformity upper and lower extremities.  Skin: No rashes, lesions, ulcers on limited skin evaluation. No induration; Warm and dry.  Neurologic: CN 2-12 grossly intact with no focal deficits. Romberg sign and cerebellar reflexes not assessed.  Psychiatric: Normal judgment and insight. Alert and oriented x 3. Normal mood and appropriate affect.   Data Reviewed: I have personally reviewed following labs and imaging studies  CBC: Recent Labs  Lab 10/11/19 1958 10/12/19 0935  WBC 15.3* 10.6*  NEUTROABS 12.7* 8.9*  HGB 15.9 14.9  HCT 47.8 44.0  MCV 93.0 92.1  PLT 298 273   Basic Metabolic Panel: Recent Labs  Lab 10/11/19 1958 10/12/19 0021 10/12/19 0935  NA 137  --  137  K 3.2*  --  3.8  CL 98  --  102  CO2 26  --  25  GLUCOSE 201*  --  147*  BUN 21  --  20  CREATININE 1.37*  --  1.04  CALCIUM 9.4  --  8.9  MG  --  1.9  --   PHOS  --   --  2.3*   GFR: Estimated Creatinine Clearance: 80 mL/min (by C-G formula based on  SCr of 1.04 mg/dL). Liver Function Tests: Recent Labs  Lab 10/11/19 1958 10/12/19 0935  AST 23 21  ALT 20 19  ALKPHOS 60 57  BILITOT 1.7* 1.9*  PROT 7.7 7.2  ALBUMIN 4.5 4.1   No results for input(s): LIPASE, AMYLASE in the last 168 hours. No results for input(s): AMMONIA in the last 168 hours. Coagulation Profile: No results for input(s): INR, PROTIME in the last 168 hours. Cardiac Enzymes: No results for input(s): CKTOTAL, CKMB, CKMBINDEX, TROPONINI in the last 168  hours. BNP (last 3 results) No results for input(s): PROBNP in the last 8760 hours. HbA1C: Recent Labs    10/12/19 0021  HGBA1C 6.4*   CBG: Recent Labs  Lab 10/12/19 0549 10/12/19 0827 10/12/19 1218  GLUCAP 145* 157* 168*   Lipid Profile: No results for input(s): CHOL, HDL, LDLCALC, TRIG, CHOLHDL, LDLDIRECT in the last 72 hours. Thyroid Function Tests: No results for input(s): TSH, T4TOTAL, FREET4, T3FREE, THYROIDAB in the last 72 hours. Anemia Panel: No results for input(s): VITAMINB12, FOLATE, FERRITIN, TIBC, IRON, RETICCTPCT in the last 72 hours. Sepsis Labs: No results for input(s): PROCALCITON, LATICACIDVEN in the last 168 hours.  Recent Results (from the past 240 hour(s))  Respiratory Panel by RT PCR (Flu A&B, Covid) - Nasopharyngeal Swab     Status: None   Collection Time: 10/11/19  9:18 PM   Specimen: Nasopharyngeal Swab  Result Value Ref Range Status   SARS Coronavirus 2 by RT PCR NEGATIVE NEGATIVE Final    Comment: (NOTE) SARS-CoV-2 target nucleic acids are NOT DETECTED.  The SARS-CoV-2 RNA is generally detectable in upper respiratoy specimens during the acute phase of infection. The lowest concentration of SARS-CoV-2 viral copies this assay can detect is 131 copies/mL. A negative result does not preclude SARS-Cov-2 infection and should not be used as the sole basis for treatment or other patient management decisions. A negative result may occur with  improper specimen  collection/handling, submission of specimen other than nasopharyngeal swab, presence of viral mutation(s) within the areas targeted by this assay, and inadequate number of viral copies (<131 copies/mL). A negative result must be combined with clinical observations, patient history, and epidemiological information. The expected result is Negative.  Fact Sheet for Patients:  https://www.moore.com/  Fact Sheet for Healthcare Providers:  https://www.young.biz/  This test is no t yet approved or cleared by the [place] FDA and  has been authorized for detection and/or diagnosis of SARS-CoV-2 by FDA under an Emergency Use Authorization (EUA). This EUA will remain  in effect (meaning this test can be used) for the duration of the COVID-19 declaration under Section 564(b)(1) of the Act, 21 U.S.C. section 360bbb-3(b)(1), unless the authorization is terminated or revoked sooner.     Influenza A by PCR NEGATIVE NEGATIVE Final   Influenza B by PCR NEGATIVE NEGATIVE Final    Comment: (NOTE) The Xpert Xpress SARS-CoV-2/FLU/RSV assay is intended as an aid in  the diagnosis of influenza from Nasopharyngeal swab specimens and  should not be used as a sole basis for treatment. Nasal washings and  aspirates are unacceptable for Xpert Xpress SARS-CoV-2/FLU/RSV  testing.  Fact Sheet for Patients: https://www.moore.com/  Fact Sheet for Healthcare Providers: https://www.young.biz/  This test is not yet approved or cleared by the [place] FDA and  has been authorized for detection and/or diagnosis of SARS-CoV-2 by  FDA under an Emergency Use Authorization (EUA). This EUA will remain  in effect (meaning this test can be used) for the duration of the  Covid-19 declaration under Section 564(b)(1) of the Act, 21  U.S.C. section 360bbb-3(b)(1), unless the authorization is  terminated or revoked. Performed at [facility], [address] [place], [place] [address]      RN Pressure Injury Documentation:     Estimated body mass index is 26.78 kg/m as calculated from the following:   Height as of this encounter: 6\' 4"  (1.93 m).   Weight as of this encounter: 99.8 kg.  Malnutrition Type:      Malnutrition  Characteristics:      Nutrition Interventions:    Radiology Studies: CT Head Wo Contrast  Result Date: 10/11/2019 CLINICAL DATA:  Head trauma, syncope EXAM: CT HEAD WITHOUT CONTRAST TECHNIQUE: Contiguous axial images were obtained from the base of the skull through the vertex without intravenous contrast. COMPARISON:  None FINDINGS: Brain: No evidence of acute infarction, hemorrhage, hydrocephalus, extra-axial collection or mass lesion/mass effect. Signs of atrophy. Vascular: No hyperdense vessel or unexpected calcification. Skull: Normal. Negative for fracture or focal lesion. Sinuses/Orbits: No acute finding. Other: Laceration with small scalp hematoma in the RIGHT paramidline occipital region with overlying dressing material IMPRESSION: 1. No acute intracranial pathology. 2. Signs of atrophy. 3. Laceration with small scalp hematoma in the RIGHT paramidline occipital region with overlying dressing material. Electronically Signed   By: Donzetta Kohut M.D.   On: 10/11/2019 20:15   CT CERVICAL SPINE WO CONTRAST  Result Date: 10/12/2019 CLINICAL DATA:  Pain following fall EXAM: CT CERVICAL SPINE WITHOUT CONTRAST TECHNIQUE: Multidetector CT imaging of the cervical spine was performed without intravenous contrast. Multiplanar CT image reconstructions were also generated. COMPARISON:  None. FINDINGS: Alignment: There is no appreciable spondylolisthesis. Skull base and vertebrae: Skull base and craniocervical junction regions appear normal. No fracture evident. No blastic or lytic bone lesions. Soft tissues and spinal canal: Prevertebral soft tissues and predental space regions are normal. There  is no appreciable cord or canal hematoma. No paraspinous lesions are evident. Disc levels: There is severe disc space narrowing at C5-6. There is mild to moderate disc space narrowing at C4-5 and C6-7. There are anterior osteophytes at C3, C4, C5, and C6. There is multilevel facet hypertrophy. Facet hypertrophy is severe on the right at C3-4 with impression on the exiting nerve root due to bony hypertrophy. Similar changes are noted on the right at C4-5. No frank disc extrusion or high-grade stenosis evident. Upper chest: Visualized upper lung regions are clear. Other: There are foci of bilateral carotid artery and left subclavian artery calcification. IMPRESSION: No fracture or spondylolisthesis. Multilevel arthropathy. Impression on exiting nerve roots on the right C3-4 and C4-5 due to bony hypertrophy. Multiple foci of facet arthropathy noted. No frank disc extrusion or high-grade stenosis. There are foci of arterial vascular calcification at several sites. Electronically Signed   By: Bretta Bang III M.D.   On: 10/12/2019 10:09   US Carotid Bilateral  Result Date: 10/12/2019 CLINICAL DATA:  69 year old male with history of syncope and dizziness. Pertinent past medical history significant for hypertension, diabetes, and tobacco use. EXAM: BILATERAL CAROTID DUPLEX ULTRASOUND TECHNIQUE: Wallace Cullens scale imaging, color Doppler and duplex ultrasound were performed of bilateral carotid and vertebral arteries in the neck. COMPARISON:  None. FINDINGS: Criteria: Quantification of carotid stenosis is based on velocity parameters that correlate the residual internal carotid diameter with NASCET-based stenosis levels, using the diameter of the distal internal carotid lumen as the denominator for stenosis measurement. The following velocity measurements were obtained: RIGHT ICA: Peak systolic velocity 98 cm/sec, End diastolic velocity 32 cm/sec CCA: Peak systolic velocity 63 cm/sec SYSTOLIC ICA/CCA RATIO:  1.6 ECA: Peak  systolic velocity 73 cm/sec LEFT ICA: Peak systolic velocity 151 cm/sec, End diastolic velocity 43 cm/sec CCA: 62 cm/sec SYSTOLIC ICA/CCA RATIO:  2.4 ECA: 77 cm/sec RIGHT CAROTID ARTERY: No significant atherosclerotic plaque formation. No significant tortuosity. Normal low resistance waveforms. RIGHT VERTEBRAL ARTERY:  Antegrade flow. LEFT CAROTID ARTERY: No significant atherosclerotic plaque formation. Increased velocity of 151 centimeters/second in the distal ICA, likely secondary to mild  tortuosity. No significant tortuosity. Normal low resistance waveforms. LEFT VERTEBRAL ARTERY:  Antegrade flow. Upper extremity non-invasive blood pressures: Not obtained. IMPRESSION: 1. Right carotid artery system: Patent without significant atherosclerotic plaque formation. 2. Left carotid artery system: Patent without significant atherosclerotic plaque formation. 3.  Vertebral artery system: Patent with antegrade flow bilaterally. Electronically Signed   By: Marliss Cootsylan  Suttle MD   On: 10/12/2019 07:49   Scheduled Meds: . enoxaparin (LOVENOX) injection  40 mg Subcutaneous Q24H  . insulin aspart  0-9 Units Subcutaneous TID PC & HS  . sodium chloride flush  3 mL Intravenous Q12H   Continuous Infusions: . sodium chloride 100 mL/hr at 10/12/19 1251    LOS: 0 days   Merlene Laughtermair Latif Mcgwire Dasaro, DO [facility] PAGER is on AMION  If 7PM-7AM, please contact night-coverage [email or web address]

## 2019-10-13 LAB — CBC WITH DIFFERENTIAL/PLATELET
Abs Immature Granulocytes: 0.02 10*3/uL (ref 0.00–0.07)
Basophils Absolute: 0 10*3/uL (ref 0.0–0.1)
Basophils Relative: 0 %
Eosinophils Absolute: 0.1 10*3/uL (ref 0.0–0.5)
Eosinophils Relative: 1 %
HCT: 42.2 % (ref 39.0–52.0)
Hemoglobin: 14.5 g/dL (ref 13.0–17.0)
Immature Granulocytes: 0 %
Lymphocytes Relative: 18 %
Lymphs Abs: 1.4 10*3/uL (ref 0.7–4.0)
MCH: 31.7 pg (ref 26.0–34.0)
MCHC: 34.4 g/dL (ref 30.0–36.0)
MCV: 92.3 fL (ref 80.0–100.0)
Monocytes Absolute: 1 10*3/uL (ref 0.1–1.0)
Monocytes Relative: 12 %
Neutro Abs: 5.6 10*3/uL (ref 1.7–7.7)
Neutrophils Relative %: 69 %
Platelets: 261 10*3/uL (ref 150–400)
RBC: 4.57 MIL/uL (ref 4.22–5.81)
RDW: 13.9 % (ref 11.5–15.5)
WBC: 8.1 10*3/uL (ref 4.0–10.5)
nRBC: 0 % (ref 0.0–0.2)

## 2019-10-13 LAB — TSH: TSH: 2.378 u[IU]/mL (ref 0.350–4.500)

## 2019-10-13 LAB — COMPREHENSIVE METABOLIC PANEL
ALT: 16 U/L (ref 0–44)
AST: 18 U/L (ref 15–41)
Albumin: 3.8 g/dL (ref 3.5–5.0)
Alkaline Phosphatase: 58 U/L (ref 38–126)
Anion gap: 9 (ref 5–15)
BUN: 17 mg/dL (ref 8–23)
CO2: 24 mmol/L (ref 22–32)
Calcium: 9 mg/dL (ref 8.9–10.3)
Chloride: 106 mmol/L (ref 98–111)
Creatinine, Ser: 1.02 mg/dL (ref 0.61–1.24)
GFR calc Af Amer: >60 mL/min (ref >60)
GFR calc non Af Amer: >60 mL/min (ref >60)
Glucose, Bld: 104 mg/dL — ABNORMAL HIGH (ref 70–99)
Potassium: 3.5 mmol/L (ref 3.5–5.1)
Sodium: 139 mmol/L (ref 135–145)
Total Bilirubin: 1.7 mg/dL — ABNORMAL HIGH (ref 0.3–1.2)
Total Protein: 6.7 g/dL (ref 6.5–8.1)

## 2019-10-13 LAB — MAGNESIUM: Magnesium: 2 mg/dL (ref 1.7–2.4)

## 2019-10-13 LAB — T4, FREE: Free T4: 1.09 ng/dL (ref 0.61–1.12)

## 2019-10-13 LAB — GLUCOSE, CAPILLARY
Glucose-Capillary: 115 mg/dL — ABNORMAL HIGH (ref 70–99)
Glucose-Capillary: 135 mg/dL — ABNORMAL HIGH (ref 70–99)

## 2019-10-13 LAB — PHOSPHORUS: Phosphorus: 2.4 mg/dL — ABNORMAL LOW (ref 2.5–4.6)

## 2019-10-13 MED ORDER — SPIRONOLACTONE 25 MG PO TABS
25.0000 mg | ORAL_TABLET | Freq: Every day | ORAL | Status: DC
  Administered 2019-10-13: 25 mg via ORAL
  Filled 2019-10-13: qty 1

## 2019-10-13 MED ORDER — ONDANSETRON HCL 4 MG PO TABS: 4.0000 mg | ORAL_TABLET | Freq: Four times a day (QID) | ORAL | 0 refills | Status: DC | PRN

## 2019-10-13 MED ORDER — INSULIN GLARGINE 100 UNIT/ML ~~LOC~~ SOLN: 10.0000 [IU] | Freq: Every day | SUBCUTANEOUS | 11 refills | Status: DC

## 2019-10-13 MED ORDER — SPIRONOLACTONE 25 MG PO TABS: 25.0000 mg | ORAL_TABLET | Freq: Every day | ORAL | 0 refills | Status: AC

## 2019-10-13 MED ORDER — K PHOS MONO-SOD PHOS DI & MONO 155-852-130 MG PO TABS
500.0000 mg | ORAL_TABLET | Freq: Once | ORAL | Status: AC
  Administered 2019-10-13: 500 mg via ORAL
  Filled 2019-10-13: qty 2

## 2019-10-13 MED ORDER — INSULIN GLARGINE 100 UNIT/ML ~~LOC~~ SOLN: 10.0000 [IU] | Freq: Every day | SUBCUTANEOUS | 11 refills | Status: AC

## 2019-10-13 MED ORDER — POTASSIUM CHLORIDE CRYS ER 20 MEQ PO TBCR
40.0000 meq | EXTENDED_RELEASE_TABLET | Freq: Two times a day (BID) | ORAL | Status: DC
  Administered 2019-10-13: 40 meq via ORAL
  Filled 2019-10-13: qty 2

## 2019-10-13 MED ORDER — ONDANSETRON HCL 4 MG PO TABS: 4.0000 mg | ORAL_TABLET | Freq: Four times a day (QID) | ORAL | 0 refills | Status: AC | PRN

## 2019-10-13 MED ORDER — SPIRONOLACTONE 25 MG PO TABS
25.0000 mg | ORAL_TABLET | Freq: Every day | ORAL | 0 refills | Status: DC
Start: 2019-10-13 — End: 2019-10-13

## 2019-10-13 NOTE — Evaluation (Signed)
Occupational Therapy Evaluation Patient Details Name: Juan Shelton MRN: [id number] DOB: 1950/02/22 Today's Date: 10/13/2019    History of Present Illness Pt is a 69 y.o. 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  CT of head (-) for acute intracranial pathology but did show laceration with small scalp hematoma in the R paramidline occipital region; c-spine negative for fx.  Pt admitted with syncope with subsequent head injury and occipital hematoma and laceration, hypokalemia, and leukocytosis.  PMH includes htn, syncope, and DM.   Clinical Impression   Juan Shelton was seen for OT evaluation this date. Prior to hospital admission, pt was generally independent with ADL/IADL management. 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 Pt states he has had no additional falls in the past 6 months. Pt lives with his spouse in a 1 level home with 3 STE. Currently pt demonstrates impairments as described below (See OT problem list) which functionally limit his ability to perform ADL/self-care tasks. Pt currently requires Supervision/CGA for safety during functional mobility, and STS ADL management including LB dressing and toileting.  Pt would benefit from skilled OT services to address noted impairments and functional limitations (see below for any additional details) in order to maximize safety and independence while minimizing falls risk and caregiver burden. Upon hospital discharge, recommend HHOT to maximize pt safety and return to functional independence during meaningful occupations of daily life.      Follow Up Recommendations  Home health OT    Equipment Recommendations  3 in 1 bedside commode Adult nurse)    Recommendations for Other Services        Precautions / Restrictions Precautions Precautions: Fall Restrictions Weight Bearing Restrictions: No      Mobility Bed Mobility Overal bed mobility: Needs Assistance Bed Mobility: Supine to Sit     Supine to sit: Supervision;HOB elevated     General bed mobility comments: Pt c/o initial dizziness comeing to sit at EOB, this resolves quickly however, and he is able to progress mobility w/o issue during session. CGA for safety during standing tasks.  Transfers Overall transfer level: Needs assistance Equipment used: Rolling walker (2 wheeled) Transfers: Sit to/from Stand Sit to Stand: Min guard Stand pivot transfers: Min guard (stand step turn bed to recliner with RW use)       General transfer comment: x1 transfer from bed and x1 transfer from recliner; fairly strong stand and controlled descent noted; steady    Balance Overall balance assessment: Needs assistance Sitting-balance support: No upper extremity supported;Feet supported Sitting balance-Leahy Scale: Normal Sitting balance - Comments: steady sitting reaching within BOS   Standing balance support: No upper extremity supported;During functional activity Standing balance-Leahy Scale: Fair Standing balance comment: Pt able to stand briefly w/o UE support when performing static grooming tasks at sink.                           ADL either performed or assessed with clinical judgement   ADL Overall ADL's : Needs assistance/impaired Eating/Feeding: Sitting;Independent   Grooming: Supervision/safety;Set up;Standing;Wash/dry hands Grooming Details (indicate cue type and reason): Pt performs hand washing at sink using RW for transfer and no physical assist required from therapist. Upper Body Bathing: Sitting;Set up;Supervision/ safety  Lower Body Bathing: Sit to/from stand;Min guard   Upper Body Dressing : Sitting;Independent   Lower Body Dressing: Min guard;Sit to/from stand   Toilet Transfer:  RW;Supervision/safety;Set up   Toileting- Clothing Manipulation and Hygiene: Min guard;Sit to/from stand       Functional mobility during ADLs: Set up;Supervision/safety;Rolling walker       Vision         Perception     Praxis      Pertinent Vitals/Pain Pain Assessment: No/denies pain     Hand Dominance Right   Extremity/Trunk Assessment Upper Extremity Assessment Upper Extremity Assessment: Generalized weakness   Lower Extremity Assessment Lower Extremity Assessment: Generalized weakness   Cervical / Trunk Assessment Cervical / Trunk Assessment: Normal   Communication Communication Communication: No difficulties   Cognition Arousal/Alertness: Awake/alert Behavior During Therapy: WFL for tasks assessed/performed Overall Cognitive Status: Within Functional Limits for tasks assessed                                     General Comments  staples in place posterior R scalp    Exercises Other Exercises Other Exercises: Pt educated on falls prevention strategies, safe use of AE/DME for ADL management, safe transfer techniques, and routines modifications to support safety and functional independence upon hospital DC. Other Exercises: OT facilitates bed mobility, STS/functional transfer to room sink, and standing grooming taks (hand hygiene) at sink this date. Safety education provided t/o functional activity.   Shoulder Instructions      Home Living Family/patient expects to be discharged to:: Private residence Living Arrangements: Spouse/significant other Available Help at Discharge: Family Type of Home: House Home Access: Stairs to enter Secretary/administrator of Steps: 3 Entrance Stairs-Rails: None Home Layout: One level     Bathroom Shower/Tub: Chief Strategy Officer: Standard     Home Equipment: Cane - single point          Prior Functioning/Environment Level of Independence: Independent        Comments: Pt denies  any other falls in past 6 months.  Uses SPC occasionally when R ankle is hurting.        OT Problem List: Decreased strength;Decreased activity tolerance;Decreased safety awareness;Impaired balance (sitting and/or standing);Decreased knowledge of use of DME or AE      OT Treatment/Interventions: Self-care/ADL training;Therapeutic exercise;Therapeutic activities;Energy conservation;DME and/or AE instruction;Patient/family education    OT Goals(Current goals can be found in the care plan section) Acute Rehab OT Goals Patient Stated Goal: to go home OT Goal Formulation: With patient Time For Goal Achievement: 10/27/19 Potential to Achieve Goals: Good ADL Goals Pt Will Transfer to Toilet: with modified independence;ambulating;bedside commode (c LRAD PRN for improved safety and functional indep.) Pt Will Perform Toileting - Clothing Manipulation and hygiene: with modified independence;sit to/from stand (c LRAD PRN for improved safety and functional indep.) Additional ADL Goal #1: Pt will independently verbalize a plan to implement at least 3 learned falls prevention strategies into his daily routines/home environment for improved safety and functional independence upon hospital DC.  OT Frequency: Min 1X/week   Barriers to D/C:            Co-evaluation              AM-PAC OT "6 Clicks" Daily Activity     Outcome Measure Help from another person eating meals?: None Help from another person taking care of personal grooming?: A Little Help  from another person toileting, which includes using toliet, bedpan, or urinal?: A Little Help from another person bathing (including washing, rinsing, drying)?: A Little Help from another person to put on and taking off regular upper body clothing?: A Little Help from another person to put on and taking off regular lower body clothing?: A Little 6 Click Score: 19   End of Session Equipment Utilized During Treatment: Gait belt;Rolling  walker  Activity Tolerance: Patient tolerated treatment well Patient left: in bed;with call bell/phone within reach;with bed alarm set;with family/visitor present  OT Visit Diagnosis: Other abnormalities of gait and mobility (R26.89);Muscle weakness (generalized) (M62.81)                Time: 6237-6283 OT Time Calculation (min): 14 min Charges:  OT General Charges $OT Visit: 1 Visit OT Evaluation $OT Eval Low Complexity: 1 Low OT Treatments $Self Care/Home Management : 8-22 mins  Rockney Ghee, M.S., OTR/L Ascom: [phone] 10/13/19, 12:46 PM

## 2019-10-13 NOTE — Discharge Summary (Signed)
Physician Discharge Summary  Juan Shelton NFA:[id number] DOB: 08-Mar-1950 DOA: 10/11/2019  PCP: Patient, No Pcp Per  Admit date: 10/11/2019 Discharge date: 10/13/2019  Admitted From: Home Disposition: Home with Home Health PT/OT  Recommendations for Outpatient Follow-up:  1. Follow up with PCP in 1-2 weeks 2. Follow up with Cardiology Dr. Welton Flakes on Tuesday 10/16/19 at 10:00 AM 3. Follow-up with Neurosurgery Dr. Jerolyn Center Abd-El-Barr if starts to develop Myelopathic Symptoms from DDD in Neck  4. Please obtain CMP/CBC, Mag, Phos in one week 5. Please follow up on the following pending results:  Home Health: Yes  Equipment/Devices: Agricultural consultant with 5" Wheels; 3in1    Discharge Condition: Stable CODE STATUS: FULL CODE  Diet recommendation: Heart Healthy Carb Modified Diet  Brief/Interim Summary: HPI per Dr. Valente David on 10/11/19 Juan Shelton a69 y.o.malewith a known history of syncope and hypertensionand type II diabetes mellituspresented to the emergency room with acute onset of syncope. He stated that he had lightheadedness and a fall before was able to sit hitting his occipital area and sustaining a hematoma and laceration. In the waiting room he had another episode however of lightheadedness with diaphoresis and presyncope without loss of consciousness.He was then noted to be bradycardic during this presyncope with a heart rate that reportedly went down to the 20s. He had a similar episodeof syncopefew years ago. He denied any chest pain or dyspnea or palpitations. No cough or wheezing or hemoptysis. No fever or chills. No nausea or vomiting or abdominal pain.  Upon presentation to the emergency room, vital signs were within normal. Labs revealed hypokalemia of 3.2 and hyperglycemia of 2 1 with creatinine 1.37 total bili 1.7 and otherwise normal LFTs. CBC showed leukocytosis of 15.3 with neutrophilia. Initial troponin I was 24. Respiratory panel is currently pending. Head  CT without contrast revealed signs of atrophy and laceration and small hematoma in the right paramedian occipital area with overlying dressing. EKG showed sinus rhythm with a rate of 84 with first-degree AV block and premature supraventricular complexes with nonspecific T wave changes anterolaterally and inferiorly and prolonged QT interval with QTC of 470 MS.  The patient will be admitted to an observation medical monitored bed for further evaluation and management.  **Interim History He was complained of neck pain so a neck CT was done and findings were below.  Cardiology was consulted and recommended echocardiogram which is still pending.  PT OT to still evaluate and treat but in the interim he was told to continue to get IV fluid maintenance with 100 MLS per hour. PT finally evaluated and recommended home health. He was orthostatic today an echocardiogram was done and showed grade 1 diastolic dysfunction. Patient felt well and much improved today. Cardiology changed him from chlorthalidone to spironolactone. He is stable to be discharged at this time and follow-up with PCP, cardiology and neurosurgery if necessary.  Discharge Diagnoses:  Active Problems:   Syncope   Syncope and collapse  Syncope and Collapse with subsequent head injury and occipital hematoma and laceration. -Differential diagnosis would includesymptomatic bradycardia,neurally mediated syncope, cardiogenic, otherarrhythmias related, orthostatic hypotension and less likely hypoglycemia. -He has a mildly elevated Troponin I at 24 and repeat was 30 -Head CT Done and showed "No acute intracranial pathology. Signs of atrophy. Laceration with small scalp hematoma in the RIGHT paramidline occipital region with overlying dressing material." -Patient was complaining of Neck pain so Neck CT was done and showed "No fracture or spondylolisthesis. Multilevel arthropathy. Impression on exiting nerve roots on the  right C3-4 and C4-5 due  to bony hypertrophy. Multiple foci of facet arthropathy noted. No frank disc extrusion or high-grade stenosis. There are foci of arterial vascular calcification at several sites." ; I have asked NeuroSurgery if they can review the film and make any additional recommendations about further imaging of if further evaluation is needed and they recommend no further imaging as patient is not myelopathic and recommend following up as an outpatient with Dr. Jerolyn Center Abd-El-Barr if necessary -The patient will be admitted to an observation medically monitored bed since he will likely cross 2 midnights and because his echocardiogram and PT OT evaluation still pending will change to inpatient -Will check orthostatics q 12 hours.  -Hold his chlorthalidone 25 mg p.o. daily, lisinopril 20 mg p.o. daily as well as Terazosin 2 mg p.o. nightly currently given his AKI and syncope; chlorthalidone will be discontinued and he will be started on spironolactone 25 mg -Will obtain a bilateral carotid Doppler and 2D echo. ECHO is still pending to be done -R and L Carotid Dopplers showed "Patent without significant atherosclerotic plaque formation." -The patient will be gently hydrated with IV normal saline at 100 mL/hr and monitored for arrhythmias on Telemetry -We will follow serial troponin I's and went fgfom  -Given his episode of bradycardia and slightly elevated troponin I will obtain cardiology consultation in a.m. -Check PT/OT evaluation and they are recommending home health PT and OT -Cardiology Dr. Welton Flakes has evaluated and recommending had a syncope with a prolonged QT interval in the setting of his hypomagnesemia and hypokalemia and advising 2 g of mag sulfate IV which has already been given and observing and as well as obtain an echocardiogram which showed diastolic failure. -Patient was given IV fluid hydration and he was not orthostatic prior to discharge; he has an appointment with cardiology set up for 10/16/2019 at  10:00 AM -He is stable to D/C Home  Chronic Diastolic CHF -Echocardiogram confirmed G1DD -Currently not volume overloaded and appears euvolemic now with his hydration  -Cardiology stopping his chlorthalidone and start him on spironolactone 25 mg p.o. daily for his HFpEF  Hypokalemia. -Mild and now improved. -K+ went from 3.2 -> 3.8 -> 3.5 -In the setting of his chlorthalidone which has now been discontinued and switched to spironolactone -Replete prior to D/C  -Continue to Monitor and Replete as Necessary -Repeat CMP in the AM  Hypophosphatemia -Patient's Phos Level was 2.3 -Replete with IV K Phos 10 mmol -Continue to Monitor and Replete as Necessary -Repeat Phos Level in the AM   Hyperbilirubinemia -Mild at 1.7 and slightly worsened to 1.9 yesterday and is now improving again to 1.7 -Continue to Monitor and Trend and Repeat CMP in the AM   Hypophosphatemia -Patient's Phos Level was 2.4 -Replete with po K Phos Neutral 500 mg x1 -Continue to Monitor and Replete as Necessary -Repeat Phos Level in the AM    Type 2 Diabetes Mellitus with mild Hyperglycemia. -Hemoglobin A1c was checked here and is now 6.4 -Patient was placed on sensitive NovoLog/scale insulin AC -We will resume his home Lantus 15 units but reduce to 10 units qHS and discharge on 10 units qHS -CBGs ranging from 115-136  AKI -Patient's BUN/creatinine on admission was 21/1.37 and is now improved to 20/1.04 with IV fluid hydration yesterday and today is 17/1.02 -Avoid his chlorthalidone 25 mg p.o. daily, lisinopril 20 once daily, and Terazosin 2 mg p.o. nightly for now; Cardiology now discontinued his Chlorthalidone and started Spironolactone 25 mg po Daily -  Continue with IV fluid hydration with normal saline at 100 mL's per hour for now until discharge -Avoid further nephrotoxic medications, contrast dyes, hypotension and renally dose medications -Repeat CMP in a.m.  Leukocytosis -It could be secondary to  stress demargination especially given his injury is a mild dehydration -Patient's WBC went from 15.3 -> 10.6 -> 8.1 -Urinalysis was obtained and showed a hazy appearance with amber-colored urine, small hemoglobin, 20 ketones, trace leukocytes, negative nitrites, urine specific gravity 1.029, no urine bacteria seen, present mucus, 0-5 RBCs per high-power field, 0-5 squamous epithelial cells and 11-20 WBCs -WBC improved and he is afebrile. Likely reative   Tobacco Abuse -Smoking cessation counseling given  Discharge Instructions  Discharge Instructions    Call MD for:  difficulty breathing, headache or visual disturbances   Complete by: As directed    Call MD for:  extreme fatigue   Complete by: As directed    Call MD for:  hives   Complete by: As directed    Call MD for:  persistant dizziness or light-headedness   Complete by: As directed    Call MD for:  persistant nausea and vomiting   Complete by: As directed    Call MD for:  redness, tenderness, or signs of infection (pain, swelling, redness, odor or green/yellow discharge around incision site)   Complete by: As directed    Call MD for:  severe uncontrolled pain   Complete by: As directed    Call MD for:  temperature >100.4   Complete by: As directed    Diet - low sodium heart healthy   Complete by: As directed    Diet Carb Modified   Complete by: As directed    Increase activity slowly   Complete by: As directed      Allergies as of 10/13/2019   No Known Allergies     Medication List    STOP taking these medications   chlorthalidone 25 MG tablet Commonly known as: HYGROTON     TAKE these medications   gabapentin 300 MG capsule Commonly known as: NEURONTIN Take 300 mg by mouth at bedtime.   insulin glargine 100 UNIT/ML injection Commonly known as: LANTUS Inject 0.1 mLs (10 Units total) into the skin at bedtime. What changed: how much to take   lisinopril 40 MG tablet Commonly known as: ZESTRIL Take 20 mg  by mouth daily.   ondansetron 4 MG tablet Commonly known as: ZOFRAN Take 1 tablet (4 mg total) by mouth every 6 (six) hours as needed for nausea.   spironolactone 25 MG tablet Commonly known as: ALDACTONE Take 1 tablet (25 mg total) by mouth daily.   terazosin 2 MG capsule Commonly known as: HYTRIN Take 2 mg by mouth at bedtime.            Durable Medical Equipment  (From admission, onward)         Start     Ordered   10/13/19 1227  DME Dan HumphreysWalker  Once       Question Answer Comment  Walker: With 5 Inch Wheels   Patient needs a walker to treat with the following condition Generalized weakness      10/13/19 1234          Follow-up Information    Laurier NancyKhan, Shaukat A, MD Follow up on 10/16/2019.   Specialty: Cardiology Why: Follow up for Appointment Tuesday 10/16/19 at 10:00 AM Contact information: [address] [place] [address] [phone]  No Known Allergies  Consultations:  Cardiology  Procedures/Studies: CT Head Wo Contrast  Result Date: 10/11/2019 CLINICAL DATA:  Head trauma, syncope EXAM: CT HEAD WITHOUT CONTRAST TECHNIQUE: Contiguous axial images were obtained from the base of the skull through the vertex without intravenous contrast. COMPARISON:  None FINDINGS: Brain: No evidence of acute infarction, hemorrhage, hydrocephalus, extra-axial collection or mass lesion/mass effect. Signs of atrophy. Vascular: No hyperdense vessel or unexpected calcification. Skull: Normal. Negative for fracture or focal lesion. Sinuses/Orbits: No acute finding. Other: Laceration with small scalp hematoma in the RIGHT paramidline occipital region with overlying dressing material IMPRESSION: 1. No acute intracranial pathology. 2. Signs of atrophy. 3. Laceration with small scalp hematoma in the RIGHT paramidline occipital region with overlying dressing material. Electronically Signed   By: Donzetta Kohut M.D.   On: 10/11/2019 20:15   CT CERVICAL SPINE WO  CONTRAST  Result Date: 10/12/2019 CLINICAL DATA:  Pain following fall EXAM: CT CERVICAL SPINE WITHOUT CONTRAST TECHNIQUE: Multidetector CT imaging of the cervical spine was performed without intravenous contrast. Multiplanar CT image reconstructions were also generated. COMPARISON:  None. FINDINGS: Alignment: There is no appreciable spondylolisthesis. Skull base and vertebrae: Skull base and craniocervical junction regions appear normal. No fracture evident. No blastic or lytic bone lesions. Soft tissues and spinal canal: Prevertebral soft tissues and predental space regions are normal. There is no appreciable cord or canal hematoma. No paraspinous lesions are evident. Disc levels: There is severe disc space narrowing at C5-6. There is mild to moderate disc space narrowing at C4-5 and C6-7. There are anterior osteophytes at C3, C4, C5, and C6. There is multilevel facet hypertrophy. Facet hypertrophy is severe on the right at C3-4 with impression on the exiting nerve root due to bony hypertrophy. Similar changes are noted on the right at C4-5. No frank disc extrusion or high-grade stenosis evident. Upper chest: Visualized upper lung regions are clear. Other: There are foci of bilateral carotid artery and left subclavian artery calcification. IMPRESSION: No fracture or spondylolisthesis. Multilevel arthropathy. Impression on exiting nerve roots on the right C3-4 and C4-5 due to bony hypertrophy. Multiple foci of facet arthropathy noted. No frank disc extrusion or high-grade stenosis. There are foci of arterial vascular calcification at several sites. Electronically Signed   By: Bretta Bang III M.D.   On: 10/12/2019 10:09   US Carotid Bilateral  Result Date: 10/12/2019 CLINICAL DATA:  69 year old male with history of syncope and dizziness. Pertinent past medical history significant for hypertension, diabetes, and tobacco use. EXAM: BILATERAL CAROTID DUPLEX ULTRASOUND TECHNIQUE: Wallace Cullens scale imaging, color  Doppler and duplex ultrasound were performed of bilateral carotid and vertebral arteries in the neck. COMPARISON:  None. FINDINGS: Criteria: Quantification of carotid stenosis is based on velocity parameters that correlate the residual internal carotid diameter with NASCET-based stenosis levels, using the diameter of the distal internal carotid lumen as the denominator for stenosis measurement. The following velocity measurements were obtained: RIGHT ICA: Peak systolic velocity 98 cm/sec, End diastolic velocity 32 cm/sec CCA: Peak systolic velocity 63 cm/sec SYSTOLIC ICA/CCA RATIO:  1.6 ECA: Peak systolic velocity 73 cm/sec LEFT ICA: Peak systolic velocity 151 cm/sec, End diastolic velocity 43 cm/sec CCA: 62 cm/sec SYSTOLIC ICA/CCA RATIO:  2.4 ECA: 77 cm/sec RIGHT CAROTID ARTERY: No significant atherosclerotic plaque formation. No significant tortuosity. Normal low resistance waveforms. RIGHT VERTEBRAL ARTERY:  Antegrade flow. LEFT CAROTID ARTERY: No significant atherosclerotic plaque formation. Increased velocity of 151 centimeters/second in the distal ICA, likely secondary to mild tortuosity. No significant tortuosity.  Normal low resistance waveforms. LEFT VERTEBRAL ARTERY:  Antegrade flow. Upper extremity non-invasive blood pressures: Not obtained. IMPRESSION: 1. Right carotid artery system: Patent without significant atherosclerotic plaque formation. 2. Left carotid artery system: Patent without significant atherosclerotic plaque formation. 3.  Vertebral artery system: Patent with antegrade flow bilaterally. Electronically Signed   By: Marliss Coots MD   On: 10/12/2019 07:49   ECHOCARDIOGRAM COMPLETE  Result Date: 10/12/2019    ECHOCARDIOGRAM REPORT   Patient Name:   Juan Shelton Date of Exam: 10/12/2019 Medical Rec #:  [id number]    Height:       76.0 in Accession #:    [id number]   Weight:       220.0 lb Date of Birth:  20-Jul-1950    BSA:          2.307 m Patient Age:    69 years     BP:           127/80  mmHg Patient Gender: M            HR:           75 bpm. Exam Location:  [facility] Procedure: 2D Echo, Cardiac Doppler and Color Doppler Indications:     Syncope 780.2 / R55  History:         Patient has no prior history of Echocardiogram examinations.                  Signs/Symptoms:Syncope; Risk Factors:Hypertension.  Sonographer:     Neysa Bonito Roar Referring Phys:  [id number] Vernetta Honey MANSY Diagnosing Phys: Julien Nordmann MD IMPRESSIONS  1. Left ventricular ejection fraction, by estimation, is 55 to 60%. The left ventricle has normal function. The left ventricle has no regional wall motion abnormalities. Left ventricular diastolic parameters are consistent with Grade I diastolic dysfunction (impaired relaxation).  2. Right ventricular systolic function is normal. The right ventricular size is normal.  3. Left atrial size was mildly dilated.  4. Mild mitral valve regurgitation. FINDINGS  Left Ventricle: Left ventricular ejection fraction, by estimation, is 55 to 60%. The left ventricle has normal function. The left ventricle has no regional wall motion abnormalities. The left ventricular internal cavity size was normal in size. There is  no left ventricular hypertrophy. Left ventricular diastolic parameters are consistent with Grade I diastolic dysfunction (impaired relaxation). Right Ventricle: The right ventricular size is normal. No increase in right ventricular wall thickness. Right ventricular systolic function is normal. Left Atrium: Left atrial size was mildly dilated. Right Atrium: Right atrial size was normal in size. Pericardium: There is no evidence of pericardial effusion. Mitral Valve: The mitral valve is normal in structure. Mild mitral valve regurgitation. No evidence of mitral valve stenosis. Tricuspid Valve: The tricuspid valve is normal in structure. Tricuspid valve regurgitation is mild . No evidence of tricuspid stenosis. Aortic Valve: The aortic valve is normal in structure. Aortic valve regurgitation is  not visualized. No aortic stenosis is present. Aortic valve peak gradient measures 13.0 mmHg. Pulmonic Valve: The pulmonic valve was normal in structure. Pulmonic valve regurgitation is trivial. No evidence of pulmonic stenosis. Aorta: The aortic root is normal in size and structure. Venous: The inferior vena cava is normal in size with greater than 50% respiratory variability, suggesting right atrial pressure of 3 mmHg. IAS/Shunts: No atrial level shunt detected by color flow Doppler.  LEFT VENTRICLE PLAX 2D LVIDd:         5.16 cm  Diastology LVIDs:  3.54 cm  LV e' medial:    5.00 cm/s LV PW:         1.20 cm  LV E/e' medial:  18.1 LV IVS:        1.27 cm  LV e' lateral:   8.70 cm/s LVOT diam:     2.00 cm  LV E/e' lateral: 10.4 LVOT Area:     3.14 cm  RIGHT VENTRICLE RV Mid diam:    3.21 cm RV S prime:     11.20 cm/s TAPSE (M-mode): 2.0 cm LEFT ATRIUM             Index       RIGHT ATRIUM           Index LA diam:        4.40 cm 1.91 cm/m  RA Area:     18.40 cm LA Vol (A2C):   99.3 ml 43.05 ml/m RA Volume:   51.10 ml  22.15 ml/m LA Vol (A4C):   65.1 ml 28.22 ml/m LA Biplane Vol: 83.5 ml 36.20 ml/m  AORTIC VALVE                PULMONIC VALVE AV Area (Vmax): 2.30 cm    PV Vmax:        0.76 m/s AV Vmax:        180.00 cm/s PV Peak grad:   2.3 mmHg AV Peak Grad:   13.0 mmHg   RVOT Peak grad: 1 mmHg LVOT Vmax:      132.00 cm/s  AORTA Ao Root diam: 3.20 cm MITRAL VALVE                TRICUSPID VALVE MV Area (PHT): 2.74 cm     TR Peak grad:   21.0 mmHg MV Decel Time: 277 msec     TR Vmax:        229.00 cm/s MV E velocity: 90.70 cm/s MV A velocity: 115.00 cm/s  SHUNTS MV E/A ratio:  0.79         Systemic Diam: 2.00 cm MV A Prime:    9.8 cm/s Julien Nordmann MD Electronically signed by Julien Nordmann MD Signature Date/Time: 10/12/2019/7:22:00 PM    Final     ECHOCARDIOGRAM IMPRESSIONS    1. Left ventricular ejection fraction, by estimation, is 55 to 60%. The  left ventricle has normal function. The left  ventricle has no regional  wall motion abnormalities. Left ventricular diastolic parameters are  consistent with Grade I diastolic  dysfunction (impaired relaxation).  2. Right ventricular systolic function is normal. The right ventricular  size is normal.  3. Left atrial size was mildly dilated.  4. Mild mitral valve regurgitation.   FINDINGS  Left Ventricle: Left ventricular ejection fraction, by estimation, is 55  to 60%. The left ventricle has normal function. The left ventricle has no  regional wall motion abnormalities. The left ventricular internal cavity  size was normal in size. There is  no left ventricular hypertrophy. Left ventricular diastolic parameters  are consistent with Grade I diastolic dysfunction (impaired relaxation).   Right Ventricle: The right ventricular size is normal. No increase in  right ventricular wall thickness. Right ventricular systolic function is  normal.   Left Atrium: Left atrial size was mildly dilated.   Right Atrium: Right atrial size was normal in size.   Pericardium: There is no evidence of pericardial effusion.   Mitral Valve: The mitral valve is normal in structure. Mild mitral valve  regurgitation. No evidence  of mitral valve stenosis.   Tricuspid Valve: The tricuspid valve is normal in structure. Tricuspid  valve regurgitation is mild . No evidence of tricuspid stenosis.   Aortic Valve: The aortic valve is normal in structure. Aortic valve  regurgitation is not visualized. No aortic stenosis is present. Aortic  valve peak gradient measures 13.0 mmHg.   Pulmonic Valve: The pulmonic valve was normal in structure. Pulmonic valve  regurgitation is trivial. No evidence of pulmonic stenosis.   Aorta: The aortic root is normal in size and structure.   Venous: The inferior vena cava is normal in size with greater than 50%  respiratory variability, suggesting right atrial pressure of 3 mmHg.   IAS/Shunts: No atrial level shunt  detected by color flow Doppler.     LEFT VENTRICLE  PLAX 2D  LVIDd:     5.16 cm Diastology  LVIDs:     3.54 cm LV e' medial:  5.00 cm/s  LV PW:     1.20 cm LV E/e' medial: 18.1  LV IVS:    1.27 cm LV e' lateral:  8.70 cm/s  LVOT diam:   2.00 cm LV E/e' lateral: 10.4  LVOT Area:   3.14 cm     RIGHT VENTRICLE  RV Mid diam:  3.21 cm  RV S prime:   11.20 cm/s  TAPSE (M-mode): 2.0 cm   LEFT ATRIUM       Index    RIGHT ATRIUM      Index  LA diam:    4.40 cm 1.91 cm/m RA Area:   18.40 cm  LA Vol (A2C):  99.3 ml 43.05 ml/m RA Volume:  51.10 ml 22.15 ml/m  LA Vol (A4C):  65.1 ml 28.22 ml/m  LA Biplane Vol: 83.5 ml 36.20 ml/m  AORTIC VALVE        PULMONIC VALVE  AV Area (Vmax): 2.30 cm  PV Vmax:    0.76 m/s  AV Vmax:    180.00 cm/s PV Peak grad:  2.3 mmHg  AV Peak Grad:  13.0 mmHg  RVOT Peak grad: 1 mmHg  LVOT Vmax:   132.00 cm/s    AORTA  Ao Root diam: 3.20 cm   MITRAL VALVE        TRICUSPID VALVE  MV Area (PHT): 2.74 cm   TR Peak grad:  21.0 mmHg  MV Decel Time: 277 msec   TR Vmax:    229.00 cm/s  MV E velocity: 90.70 cm/s  MV A velocity: 115.00 cm/s SHUNTS  MV E/A ratio: 0.79     Systemic Diam: 2.00 cm  MV A Prime:  9.8 cm/s   Subjective: Seen and examined at bedside he felt much improved and felt back to baseline. Denies any chest pain, lightheadedness or dizziness. No nausea or vomiting. Ambulated with therapy and felt well. No other concerns or plans at this time and understands that he needs to follow-up with PCP as well as cardiology in outpatient setting.  Discharge Exam: Vitals:   10/13/19 0619 10/13/19 0741  BP: (!) 162/100 (!) 149/98  Pulse: (!) 55 66  Resp: 20 18  Temp: 98.5 F (36.9 C) 98.3 F (36.8 C)  SpO2: 98% 99%   Vitals:   10/12/19 2035 10/12/19 2125 10/13/19 0619 10/13/19 0741  BP: (!) 143/81 (!) 153/97 (!) 162/100 (!) 149/98   Pulse: 83 83 (!) 55 66  Resp: 20 20 20 18   Temp:  97.9 F (36.6 C) 98.5 F (36.9 C) 98.3 F (36.8 C)  TempSrc:  Oral Oral Oral  SpO2: 98% 96% 98% 99%  Weight:      Height:       General: Pt is alert, awake, not in acute distress Cardiovascular: RRR, S1/S2 +, no rubs, no gallops Respiratory: Managed bilaterally, no wheezing, no rhonchi Abdominal: Soft, NT, mildly distended, bowel sounds + Extremities: no edema, no cyanosis  The results of significant diagnostics from this hospitalization (including imaging, microbiology, ancillary and laboratory) are listed below for reference.    Microbiology: Recent Results (from the past 240 hour(s))  Respiratory Panel by RT PCR (Flu A&B, Covid) - Nasopharyngeal Swab     Status: None   Collection Time: 10/11/19  9:18 PM   Specimen: Nasopharyngeal Swab  Result Value Ref Range Status   SARS Coronavirus 2 by RT PCR NEGATIVE NEGATIVE Final    Comment: (NOTE) SARS-CoV-2 target nucleic acids are NOT DETECTED.  The SARS-CoV-2 RNA is generally detectable in upper respiratoy specimens during the acute phase of infection. The lowest concentration of SARS-CoV-2 viral copies this assay can detect is 131 copies/mL. A negative result does not preclude SARS-Cov-2 infection and should not be used as the sole basis for treatment or other patient management decisions. A negative result may occur with  improper specimen collection/handling, submission of specimen other than nasopharyngeal swab, presence of viral mutation(s) within the areas targeted by this assay, and inadequate number of viral copies (<131 copies/mL). A negative result must be combined with clinical observations, patient history, and epidemiological information. The expected result is Negative.  Fact Sheet for Patients:  https://www.moore.com/  Fact Sheet for Healthcare Providers:  https://www.young.biz/  This test is no t yet approved or  cleared by the [place] FDA and  has been authorized for detection and/or diagnosis of SARS-CoV-2 by FDA under an Emergency Use Authorization (EUA). This EUA will remain  in effect (meaning this test can be used) for the duration of the COVID-19 declaration under Section 564(b)(1) of the Act, 21 U.S.C. section 360bbb-3(b)(1), unless the authorization is terminated or revoked sooner.     Influenza A by PCR NEGATIVE NEGATIVE Final   Influenza B by PCR NEGATIVE NEGATIVE Final    Comment: (NOTE) The Xpert Xpress SARS-CoV-2/FLU/RSV assay is intended as an aid in  the diagnosis of influenza from Nasopharyngeal swab specimens and  should not be used as a sole basis for treatment. Nasal washings and  aspirates are unacceptable for Xpert Xpress SARS-CoV-2/FLU/RSV  testing.  Fact Sheet for Patients: https://www.moore.com/  Fact Sheet for Healthcare Providers: https://www.young.biz/  This test is not yet approved or cleared by the [place] FDA and  has been authorized for detection and/or diagnosis of SARS-CoV-2 by  FDA under an Emergency Use Authorization (EUA). This EUA will remain  in effect (meaning this test can be used) for the duration of the  Covid-19 declaration under Section 564(b)(1) of the Act, 21  U.S.C. section 360bbb-3(b)(1), unless the authorization is  terminated or revoked. Performed at [facility], [address] [place], [place] [address]     Labs: BNP (last 3 results) No results for input(s): BNP in the last 8760 hours. Basic Metabolic Panel: Recent Labs  Lab 10/11/19 1958 10/12/19 0021 10/12/19 0935 10/13/19 0510  NA 137  --  137 139  K 3.2*  --  3.8 3.5  CL 98  --  102 106  CO2 26  --  25 24  GLUCOSE 201*  --  147* 104*  BUN 21  --  20 17  CREATININE 1.37*  --  1.04 1.02  CALCIUM 9.4  --  8.9 9.0  MG  --  1.9  --  2.0  PHOS  --   --  2.3* 2.4*   Liver Function Tests: Recent Labs  Lab  10/11/19 1958 10/12/19 0935 10/13/19 0510  AST ALT ALKPHOS 60 57 58  BILITOT 1.7* 1.9* 1.7*  PROT 7.7 7.2 6.7  ALBUMIN 4.5 4.1 3.8   No results for input(s): LIPASE, AMYLASE in the last 168 hours. No results for input(s): AMMONIA in the last 168 hours. CBC: Recent Labs  Lab 10/11/19 1958 10/12/19 0935 10/13/19 0510  WBC 15.3* 10.6* 8.1  NEUTROABS 12.7* 8.9* 5.6  HGB 15.9 14.9 14.5  HCT 47.8 44.0 42.2  MCV 93.0 92.1 92.3  PLT 298 273 261   Cardiac Enzymes: No results for input(s): CKTOTAL, CKMB, CKMBINDEX, TROPONINI in the last 168 hours. BNP: Invalid input(s): POCBNP CBG: Recent Labs  Lab 10/12/19 1218 10/12/19 1803 10/12/19 2145 10/13/19 0739 10/13/19 1210  GLUCAP 168* 136* 126* 115* 135*   D-Dimer No results for input(s): DDIMER in the last 72 hours. Hgb A1c Recent Labs    10/12/19 0021  HGBA1C 6.4*   Lipid Profile No results for input(s): CHOL, HDL, LDLCALC, TRIG, CHOLHDL, LDLDIRECT in the last 72 hours. Thyroid function studies Recent Labs    10/13/19 0510  TSH 2.378   Anemia work up No results for input(s): VITAMINB12, FOLATE, FERRITIN, TIBC, IRON, RETICCTPCT in the last 72 hours. Urinalysis    Component Value Date/Time   COLORURINE AMBER (A) 10/12/2019 0021   APPEARANCEUR HAZY (A) 10/12/2019 0021   LABSPEC 1.029 10/12/2019 0021   PHURINE 5.0 10/12/2019 0021   GLUCOSEU NEGATIVE 10/12/2019 0021   HGBUR SMALL (A) 10/12/2019 0021   BILIRUBINUR NEGATIVE 10/12/2019 0021   KETONESUR 20 (A) 10/12/2019 0021   PROTEINUR 30 (A) 10/12/2019 0021   NITRITE NEGATIVE 10/12/2019 0021   LEUKOCYTESUR TRACE (A) 10/12/2019 0021   Sepsis Labs Invalid input(s): PROCALCITONIN,  WBC,  LACTICIDVEN Microbiology Recent Results (from the past 240 hour(s))  Respiratory Panel by RT PCR (Flu A&B, Covid) - Nasopharyngeal Swab     Status: None   Collection Time: 10/11/19  9:18 PM   Specimen: Nasopharyngeal Swab  Result Value Ref Range Status    SARS Coronavirus 2 by RT PCR NEGATIVE NEGATIVE Final    Comment: (NOTE) SARS-CoV-2 target nucleic acids are NOT DETECTED.  The SARS-CoV-2 RNA is generally detectable in upper respiratoy specimens during the acute phase of infection. The lowest concentration of SARS-CoV-2 viral copies this assay can detect is 131 copies/mL. A negative result does not preclude SARS-Cov-2 infection and should not be used as the sole basis for treatment or other patient management decisions. A negative result may occur with  improper specimen collection/handling, submission of specimen other than nasopharyngeal swab, presence of viral mutation(s) within the areas targeted by this assay, and inadequate number of viral copies (<131 copies/mL). A negative result must be combined with clinical observations, patient history, and epidemiological information. The expected result is Negative.  Fact Sheet for Patients:  https://www.moore.com/  Fact Sheet for Healthcare Providers:  https://www.young.biz/  This test is no t yet approved or cleared by the [place] FDA and  has been authorized for detection and/or diagnosis of SARS-CoV-2 by FDA under an Emergency Use Authorization (EUA). This EUA will remain  in effect (meaning this test can be used) for the duration of  the COVID-19 declaration under Section 564(b)(1) of the Act, 21 U.S.C. section 360bbb-3(b)(1), unless the authorization is terminated or revoked sooner.     Influenza A by PCR NEGATIVE NEGATIVE Final   Influenza B by PCR NEGATIVE NEGATIVE Final    Comment: (NOTE) The Xpert Xpress SARS-CoV-2/FLU/RSV assay is intended as an aid in  the diagnosis of influenza from Nasopharyngeal swab specimens and  should not be used as a sole basis for treatment. Nasal washings and  aspirates are unacceptable for Xpert Xpress SARS-CoV-2/FLU/RSV  testing.  Fact Sheet for  Patients: https://www.moore.com/  Fact Sheet for Healthcare Providers: https://www.young.biz/  This test is not yet approved or cleared by the [place] FDA and  has been authorized for detection and/or diagnosis of SARS-CoV-2 by  FDA under an Emergency Use Authorization (EUA). This EUA will remain  in effect (meaning this test can be used) for the duration of the  Covid-19 declaration under Section 564(b)(1) of the Act, 21  U.S.C. section 360bbb-3(b)(1), unless the authorization is  terminated or revoked. Performed at [facility], [address], [place], [place] [address]    Time coordinating discharge: 35 minutes  SIGNED:  Merlene Laughter, DO [facility] 10/13/2019, 7:13 PM Pager is on AMION  If 7PM-7AM, please contact night-coverage [email or web address]

## 2019-10-13 NOTE — Evaluation (Signed)
Physical Therapy Evaluation Patient Details Name: Juan Shelton MRN: [id number] DOB: [birth year] Today's Date: 10/13/2019   History of Present Illness  Pt is a 69 y.o. 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  CT of head (-) for acute intracranial pathology but did show laceration with small scalp hematoma in the R paramidline occipital region; c-spine negative for fx.  Pt admitted with syncope with subsequent head injury and occipital hematoma and laceration, hypokalemia, and leukocytosis.  PMH includes htn, syncope, and DM.  Clinical Impression  Prior to hospital admission, pt was independent with ambulation (occasional use of SPC when his R ankle was bothering him); lives with his wife in 1 level home with 3 STE (no railing).  Currently pt is SBA semi-supine to sitting edge of bed; CGA with transfers (RW use); and CGA with ambulation 200 feet with RW (chair follow provided for safety).  Pt reporting initial dizziness sitting up in bed long sitting (pt sat up fairly quickly: therapist educated pt on taking his time with transfers to attempt to prevent dizziness).  Orthostatic vitals taken (given to pt's nurse who entered into EMR)--2/10 dizziness per pt report in standing (symptoms resolved when sitting).  Pt appearing cautious initially with gait but improved step length noted during ambulation as pt's confidence improved.  Pt would benefit from skilled PT to address noted impairments and functional limitations (see below for any additional details).  Upon hospital discharge, pt would benefit from HHPT, RW use, and initial SBA for functional mobility for safety.    Follow Up Recommendations Home health PT;Supervision for mobility/OOB    Equipment Recommendations  Rolling walker with 5" wheels     Recommendations for Other Services OT consult     Precautions / Restrictions Precautions Precautions: Fall Restrictions Weight Bearing Restrictions: No      Mobility  Bed Mobility Overal bed mobility: Needs Assistance Bed Mobility: Supine to Sit     Supine to sit: Supervision;HOB elevated     General bed mobility comments: SBA semi-supine to sitting edge of bed d/t pt c/o initial dizziness (pt sat up into long-sitting fairly quickly--therapist educated pt to perform transfers more slowly to attempt to prevent dizziness)  Transfers Overall transfer level: Needs assistance Equipment used: Rolling walker (2 wheeled) Transfers: Sit to/from [employer] Sit to Stand: Min guard Stand pivot transfers: Min guard (stand step turn bed to recliner with RW use)       General transfer comment: x1 transfer from bed and x1 transfer from recliner; fairly strong stand and controlled descent noted; steady  Ambulation/Gait Ambulation/Gait assistance: Min guard Gait Distance (Feet): 200 Feet Assistive device: Rolling walker (2 wheeled)   Gait velocity: mildly decreased   General Gait Details: initially decreased B LE step length but with improved confidence pt's step lengths became longer; steady with RW use  Stairs            Wheelchair Mobility    Modified Rankin (Stroke Patients Only)       Balance Overall balance assessment: Needs assistance Sitting-balance support: No upper extremity supported;Feet supported Sitting balance-Leahy Scale: Normal Sitting balance - Comments: steady sitting reaching within BOS   Standing balance support: Single extremity supported Standing balance-Leahy Scale: Poor Standing balance comment: pt requiring at least single UE support for static  standing balance                             Pertinent Vitals/Pain Pain Assessment: No/denies pain  End of session BP 153/94, HR 85 bpm, and O2 sats 98% on room air.     Home Living Family/patient expects to be discharged to:: Private residence Living Arrangements: Spouse/significant other Available Help at Discharge: Family Type of Home: House Home Access: Stairs to enter Entrance Stairs-Rails: None Entrance Stairs-Number of Steps: 3 Home Layout: One level Home Equipment: Cane - single point      Prior Function Level of Independence: Independent         Comments: Pt denies any other falls in past 6 months.  Uses SPC occasionally when R ankle is hurting.     Hand Dominance        Extremity/Trunk Assessment   Upper Extremity Assessment Upper Extremity Assessment: Generalized weakness    Lower Extremity Assessment Lower Extremity Assessment: Generalized weakness    Cervical / Trunk Assessment Cervical / Trunk Assessment: Normal  Communication   Communication: No difficulties  Cognition Arousal/Alertness: Awake/alert Behavior During Therapy: WFL for tasks assessed/performed Overall Cognitive Status: Within Functional Limits for tasks assessed                                        General Comments General comments (skin integrity, edema, etc.): staples in place posterior R scalp.  Nursing cleared pt for participation in physical therapy.  Pt agreeable to PT session.  Pt's wife present during session.    Exercises  Transfer and gait training with RW; orthostatic vitals   Assessment/Plan    PT Assessment Patient needs continued PT services  PT Problem List Decreased strength;Decreased activity tolerance;Decreased balance;Decreased mobility;Decreased knowledge of use of DME       PT Treatment Interventions DME instruction;Gait training;Stair training;Functional mobility training;Therapeutic activities;Therapeutic exercise;Balance training;Patient/family education    PT Goals (Current goals can be found in the Care Plan section)  Acute Rehab PT Goals Patient Stated Goal: to go home PT Goal Formulation: With  patient Time For Goal Achievement: 10/27/19 Potential to Achieve Goals: Good    Frequency Min 2X/week   Barriers to discharge        Co-evaluation               AM-PAC PT "6 Clicks" Mobility  Outcome Measure Help needed turning from your back to your side while in a flat bed without using bedrails?: None Help needed moving from lying on your back to sitting on the side of a flat bed without using bedrails?: A Little Help needed moving to and from a bed to a chair (including a wheelchair)?: A Little Help needed standing up from a chair using your arms (e.g., wheelchair or bedside chair)?: A Little Help needed to walk in hospital room?: A Little Help needed climbing 3-5 steps with a railing? : A Little 6 Click Score: 19    End of Session Equipment Utilized During Treatment: Gait belt Activity Tolerance: Patient tolerated treatment well Patient left: in chair;with call bell/phone within reach;with chair alarm set;with family/visitor present Nurse Communication: Mobility status;Precautions;Other (comment) (pt's orthostatic vitals) PT Visit Diagnosis: Other abnormalities of gait and mobility (R26.89);Muscle weakness (generalized) (M62.81);History of falling (Z91.81);Difficulty in walking, not elsewhere classified (R26.2);Dizziness and giddiness (R42)    Time: 6295-2841 PT Time  Calculation (min) (ACUTE ONLY): 40 min   Charges:   PT Evaluation $PT Eval Low Complexity: 1 Low PT Treatments $Gait Training: 8-22 mins $Therapeutic Exercise: 8-22 mins       Hendricks Limes, PT 10/13/19, 11:45 AM

## 2019-10-13 NOTE — Progress Notes (Signed)
SUBJECTIVE: Feeling much better  Vitals:   10/12/19 2035 10/12/19 2125 10/13/19 0619 10/13/19 0741  BP: (!) 143/81 (!) 153/97 (!) 162/100 (!) 149/98  Pulse: 83 83 (!) 55 66  Resp: 20 20 20 18   Temp:  97.9 F (36.6 C) 98.5 F (36.9 C) 98.3 F (36.8 C)  TempSrc:  Oral Oral Oral  SpO2: 98% 96% 98% 99%  Weight:      Height:        Intake/Output Summary (Last 24 hours) at 10/13/2019 1220 Last data filed at 10/13/2019 0612 Gross per 24 hour  Intake 2481.39 ml  Output --  Net 2481.39 ml    LABS: Basic Metabolic Panel: Recent Labs    10/11/19 1958 10/12/19 0021 10/12/19 0935 10/13/19 0510  NA   < >  --  137 139  K   < >  --  3.8 3.5  CL   < >  --  102 106  CO2   < >  --  25 24  GLUCOSE   < >  --  147* 104*  BUN   < >  --  20 17  CREATININE   < >  --  1.04 1.02  CALCIUM   < >  --  8.9 9.0  MG  --  1.9  --  2.0  PHOS  --   --  2.3* 2.4*   < > = values in this interval not displayed.   Liver Function Tests: Recent Labs    10/12/19 0935 10/13/19 0510  AST 21 18  ALT 19 16  ALKPHOS 57 58  BILITOT 1.9* 1.7*  PROT 7.2 6.7  ALBUMIN 4.1 3.8   No results for input(s): LIPASE, AMYLASE in the last 72 hours. CBC: Recent Labs    10/12/19 0935 10/13/19 0510  WBC 10.6* 8.1  NEUTROABS 8.9* 5.6  HGB 14.9 14.5  HCT 44.0 42.2  MCV 92.1 92.3  PLT 273 261   Cardiac Enzymes: No results for input(s): CKTOTAL, CKMB, CKMBINDEX, TROPONINI in the last 72 hours. BNP: Invalid input(s): POCBNP D-Dimer: No results for input(s): DDIMER in the last 72 hours. Hemoglobin A1C: Recent Labs    10/12/19 0021  HGBA1C 6.4*   Fasting Lipid Panel: No results for input(s): CHOL, HDL, LDLCALC, TRIG, CHOLHDL, LDLDIRECT in the last 72 hours. Thyroid Function Tests: Recent Labs    10/13/19 0510  TSH 2.378   Anemia Panel: No results for input(s): VITAMINB12, FOLATE, FERRITIN, TIBC, IRON, RETICCTPCT in the last 72 hours.   PHYSICAL EXAM General: Well developed, well nourished, in no  acute distress HEENT:  Normocephalic and atramatic Neck:  No JVD.  Lungs: Clear bilaterally to auscultation and percussion. Heart: HRRR . Normal S1 and S2 without gallops or murmurs.  Abdomen: Bowel sounds are positive, abdomen soft and non-tender  Msk:  Back normal, normal gait. Normal strength and tone for age. Extremities: No clubbing, cyanosis or edema.   Neuro: Alert and oriented X 3. Psych:  Good affect, responds appropriately  TELEMETRY: Normal sinus rhythm  ASSESSMENT AND PLAN: Syncopal episode with diastolic dysfunction on echocardiogram.  Also has hypokalemia and HFpEF.  Advised Aldactone instead of chlorthalidone.  Patient had prolonged QT interval probably due to hypokalemia and Aldactone would be better for that as well as for HFpEF. Active Problems:   Syncope   Syncope and collapse    Juan Shelton A, MD, [facility] 10/13/2019 12:20 PM

## 2019-10-13 NOTE — Progress Notes (Signed)
Juan Shelton to be D/C'd home with wife per MD order.  Discussed prescriptions and follow up appointments with the patient. Prescriptions given to patient, medication list explained in detail. Pt verbalized understanding.  Allergies as of 10/13/2019   No Known Allergies      Medication List     STOP taking these medications    chlorthalidone 25 MG tablet Commonly known as: HYGROTON       TAKE these medications    gabapentin 300 MG capsule Commonly known as: NEURONTIN Take 300 mg by mouth at bedtime.   insulin glargine 100 UNIT/ML injection Commonly known as: LANTUS Inject 0.1 mLs (10 Units total) into the skin at bedtime. What changed: how much to take   lisinopril 40 MG tablet Commonly known as: ZESTRIL Take 20 mg by mouth daily.   ondansetron 4 MG tablet Commonly known as: ZOFRAN Take 1 tablet (4 mg total) by mouth every 6 (six) hours as needed for nausea.   spironolactone 25 MG tablet Commonly known as: ALDACTONE Take 1 tablet (25 mg total) by mouth daily.   terazosin 2 MG capsule Commonly known as: HYTRIN Take 2 mg by mouth at bedtime.               Durable Medical Equipment  (From admission, onward)           Start     Ordered   10/13/19 1227  DME Dan Humphreys  Once       Question Answer Comment  Walker: With 5 Inch Wheels   Patient needs a walker to treat with the following condition Generalized weakness      10/13/19 1234            Vitals:   10/13/19 0619 10/13/19 0741  BP: (!) 162/100 (!) 149/98  Pulse: (!) 55 66  Resp: 20 18  Temp: 98.5 F (36.9 C) 98.3 F (36.8 C)  SpO2: 98% 99%    Skin clean, dry and intact without evidence of skin break down, no evidence of skin tears noted. IV catheter discontinued intact. Site without signs and symptoms of complications. Dressing and pressure applied. Pt denies pain at this time. No complaints noted.  An After Visit Summary was printed and given to the patient. Patient escorted via WC,  and D/C home via private auto.  Juan Shelton

## 2019-10-13 NOTE — Progress Notes (Deleted)
OT Cancellation Note  Patient Details Name: Juan Shelton MRN: [id number] DOB: 10/07/1950   Cancelled Treatment:    Reason Eval/Treat Not Completed: Medical issues which prohibited therapy. OT order received and chart reviewed. Pt noted with troponin up-trending from 24 to 30. ECHO pending.  D/t up-trending troponin, will hold OT at this time, monitor pt's status, and re-attempt OT eval at a later date/time as medically appropriate.  Rockney Ghee, M.S., OTR/L Ascom: [phone] 10/13/19, 11:11 AM

## 2019-10-22 ENCOUNTER — Emergency Department
Admission: EM | Admit: 2019-10-22 | Discharge: 2019-10-22 | Disposition: A | Discharge status: Home / Self Care | Payer: Non-veteran care | Attending: Emergency Medicine | Admitting: Emergency Medicine

## 2019-10-22 ENCOUNTER — Other Ambulatory Visit: Payer: Self-pay

## 2019-10-22 DIAGNOSIS — Z4802 Encounter for removal of sutures: Secondary | ICD-10-CM | POA: Insufficient documentation

## 2019-10-22 DIAGNOSIS — F17210 Nicotine dependence, cigarettes, uncomplicated: Secondary | ICD-10-CM | POA: Insufficient documentation

## 2019-10-22 DIAGNOSIS — S0101XD Laceration without foreign body of scalp, subsequent encounter: Secondary | ICD-10-CM | POA: Insufficient documentation

## 2019-10-22 DIAGNOSIS — I10 Essential (primary) hypertension: Secondary | ICD-10-CM | POA: Insufficient documentation

## 2019-10-22 DIAGNOSIS — X58XXXD Exposure to other specified factors, subsequent encounter: Secondary | ICD-10-CM | POA: Insufficient documentation

## 2019-10-22 DIAGNOSIS — Z79899 Other long term (current) drug therapy: Secondary | ICD-10-CM | POA: Insufficient documentation

## 2019-10-22 NOTE — ED Provider Notes (Signed)
Emergency Department Provider Note  ____________________________________________  Time seen: Approximately 8:00 PM  I have reviewed the triage vital signs and the nursing notes.   HISTORY  Chief Complaint Suture removal   Historian Patient     HPI Juan Shelton is a 69 y.o. male presents to the emergency department for staple removal.  Patient has no wound concerns.   Past Medical History:  Diagnosis Date  . Hypertension   . Syncope      Immunizations up to date:  Yes.     Past Medical History:  Diagnosis Date  . Hypertension   . Syncope     Patient Active Problem List   Diagnosis Date Noted  . Syncope and collapse 10/12/2019  . Syncope 10/11/2019    History reviewed. No pertinent surgical history.  Prior to Admission medications   Medication Sig Start Date End Date Taking? Authorizing Provider  gabapentin (NEURONTIN) 300 MG capsule Take 300 mg by mouth at bedtime.    [provider]  insulin glargine (LANTUS) 100 UNIT/ML injection Inject 0.1 mLs (10 Units total) into the skin at bedtime. 10/13/19   Marguerita Merles Latif, DO  lisinopril (ZESTRIL) 40 MG tablet Take 20 mg by mouth daily.    [provider]  ondansetron (ZOFRAN) 4 MG tablet Take 1 tablet (4 mg total) by mouth every 6 (six) hours as needed for nausea. 10/13/19   Marguerita Merles Latif, DO  spironolactone (ALDACTONE) 25 MG tablet Take 1 tablet (25 mg total) by mouth daily. 10/13/19   Marguerita Merles Latif, DO  terazosin (HYTRIN) 2 MG capsule Take 2 mg by mouth at bedtime.    [provider]    Allergies Patient has no known allergies.  No family history on file.  Social History Social History   Tobacco Use  . Smoking status: Current Every Day Smoker    Types: Cigarettes  . Smokeless tobacco: Never Used  Substance Use Topics  . Alcohol use: Not on file  . Drug use: Not on file     Review of Systems  Constitutional: No fever/chills Eyes:  No discharge ENT: No  upper respiratory complaints. Respiratory: no cough. No SOB/ use of accessory muscles to breath Gastrointestinal:   No nausea, no vomiting.  No diarrhea.  No constipation. Musculoskeletal: Negative for musculoskeletal pain. Skin: Patient has staples of scalp.     ____________________________________________   PHYSICAL EXAM:  VITAL SIGNS: ED Triage Vitals  Enc Vitals Group     BP 10/22/19 1641 (!) 177/97     Pulse Rate 10/22/19 1641 65     Resp 10/22/19 1641 18     Temp 10/22/19 1641 98.7 F (37.1 C)     Temp src --      SpO2 10/22/19 1641 99 %     Weight 10/22/19 1639 220 lb (99.8 kg)     Height 10/22/19 1639 6\' 4"  (1.93 m)     Head Circumference --      Peak Flow --      Pain Score 10/22/19 1639 0     Pain Loc --      Pain Edu? --      Excl. in GC? --      Constitutional: Alert and oriented. Well appearing and in no acute distress. Eyes: Conjunctivae are normal. PERRL. EOMI. Head: Atraumatic. ENT:      Ears: TMs are pearly.       Nose: No congestion/rhinnorhea.      Mouth/Throat: Mucous membranes are moist.  Neck: No stridor.  No cervical spine tenderness to palpation. Cardiovascular: Normal rate, regular rhythm. Normal S1 and S2.  Good peripheral circulation. Respiratory: Normal respiratory effort without tachypnea or retractions. Lungs CTAB. Good air entry to the bases with no decreased or absent breath sounds Gastrointestinal: Bowel sounds x 4 quadrants. Soft and nontender to palpation. No guarding or rigidity. No distention. Musculoskeletal: Full range of motion to all extremities. No obvious deformities noted Neurologic:  Normal for age. No gross focal neurologic deficits are appreciated.  Skin: Patient has 11 staples of scalp. Psychiatric: Mood and affect are normal for age. Speech and behavior are normal.   ____________________________________________   LABS (all labs ordered are listed, but only abnormal results are displayed)  Labs Reviewed - No data  to display ____________________________________________  EKG   ____________________________________________  RADIOLOGY   No results found.  ____________________________________________    PROCEDURES  Procedure(s) performed:     .Suture Removal  Date/Time: 10/22/2019 8:02 PM Performed by: Orvil Feil, PA-C Authorized by: Orvil Feil, PA-C   Consent:    Consent obtained:  Verbal   Consent given by:  Patient   Risks discussed:  Bleeding and pain Location:    Location:  Head/neck   Head/neck location:  Scalp Procedure details:    Number of staples removed:  11       Medications - No data to display   ____________________________________________   INITIAL IMPRESSION / ASSESSMENT AND PLAN / ED COURSE  Pertinent labs & imaging results that were available during my care of the patient were reviewed by me and considered in my medical decision making (see chart for details).      Assessment and Plan: Staple removal 69 year old male presents to the emergency department for staple removal.  11 staples were removed without complication.  Return precautions were given to return with new or worsening symptoms.   ____________________________________________  FINAL CLINICAL IMPRESSION(S) / ED DIAGNOSES  Final diagnoses:  Encounter for staple removal      NEW MEDICATIONS STARTED DURING THIS VISIT:  ED Discharge Orders    None          This chart was dictated using voice recognition software/Dragon. Despite best efforts to proofread, errors can occur which can change the meaning. Any change was purely unintentional.     Orvil Feil, PA-C 10/22/19 2003    Gilles Chiquito, MD 10/22/19 2050

## 2019-10-22 NOTE — ED Triage Notes (Signed)
Pt here for suture removal from back of head. Pt denies any other symptoms.

## 2020-10-06 ENCOUNTER — Ambulatory Visit: Payer: Self-pay | Admitting: *Deleted

## 2020-10-06 NOTE — Telephone Encounter (Signed)
Pt reports urinary frequency and urgency. Also reports left sided lower back pain and "Pressure" at bladder area.,intermittent. Denies fever, no dysuria. Has established care at [facility], appt in November. Advised mobile clinic, declines. Advised UC, states will follow disposition.   Reason for Disposition  Urinating more frequently than usual (i.e., frequency)  Answer Assessment - Initial Assessment Questions 1. SYMPTOM: "What's the main symptom you're concerned about?" (e.g., frequency, incontinence)     Frequency, urgency, bladder pressure, 2. ONSET: "When did the  *No Answer*  start?"      Week ago 3. PAIN: "Is there any pain?" If Yes, ask: "How bad is it?" (Scale: 1-10; mild, moderate, severe)     Left lower back, intermittent 4. CAUSE: "What do you think is causing the symptoms?"     Unsure 5. OTHER SYMPTOMS: "Do you have any other symptoms?" (e.g., fever, flank pain, blood in urine, pain with urination)     Flank pain  Protocols used: Urinary Symptoms-A-AH

## 2020-11-24 ENCOUNTER — Ambulatory Visit: Payer: Self-pay | Admitting: Nurse Practitioner

## 2020-11-24 NOTE — Progress Notes (Deleted)
There were no vitals taken for this visit.   Subjective:    Patient ID: Juan Shelton, male    DOB: 12-20-1950, 70 y.o.   MRN: [id number]  HPI: Juan Shelton is a 70 y.o. male  No chief complaint on file.  Patient presents to clinic to establish care with new PCP.  Patient reports a history of ***. Patient denies a history of: Hypertension, Elevated Cholesterol, Diabetes, Thyroid problems, Depression, Anxiety, Neurological problems, and Abdominal problems.   Active Ambulatory Problems    Diagnosis Date Noted   Syncope 10/11/2019   Syncope and collapse 10/12/2019   Resolved Ambulatory Problems    Diagnosis Date Noted   No Resolved Ambulatory Problems   Past Medical History:  Diagnosis Date   Hypertension    No past surgical history on file. No family history on file.  Relevant past medical, surgical, family and social history reviewed and updated as indicated. Interim medical history since our last visit reviewed. Allergies and medications reviewed and updated.  Review of Systems  Per HPI unless specifically indicated above     Objective:    There were no vitals taken for this visit.  Wt Readings from Last 3 Encounters:  10/22/19 220 lb (99.8 kg)  10/11/19 220 lb (99.8 kg)    Physical Exam  Results for orders placed or performed during the hospital encounter of 10/11/19  Respiratory Panel by RT PCR (Flu A&B, Covid) - Nasopharyngeal Swab   Specimen: Nasopharyngeal Swab  Result Value Ref Range   SARS Coronavirus 2 by RT PCR NEGATIVE NEGATIVE   Influenza A by PCR NEGATIVE NEGATIVE   Influenza B by PCR NEGATIVE NEGATIVE  CBC with Differential  Result Value Ref Range   WBC 15.3 (H) 4.0 - 10.5 K/uL   RBC 5.14 4.22 - 5.81 MIL/uL   Hemoglobin 15.9 13.0 - 17.0 g/dL   HCT 40.9 81.1 - 91.4 %   MCV 93.0 80.0 - 100.0 fL   MCH 30.9 26.0 - 34.0 pg   MCHC 33.3 30.0 - 36.0 g/dL   RDW 78.2 95.6 - 21.3 %   Platelets 298 150 - 400 K/uL   nRBC 0.0 0.0 - 0.2 %   Neutrophils  Relative % 82 %   Neutro Abs 12.7 (H) 1.7 - 7.7 K/uL   Lymphocytes Relative 9 %   Lymphs Abs 1.3 0.7 - 4.0 K/uL   Monocytes Relative 8 %   Monocytes Absolute 1.2 (H) 0.1 - 1.0 K/uL   Eosinophils Relative 0 %   Eosinophils Absolute 0.0 0.0 - 0.5 K/uL   Basophils Relative 0 %   Basophils Absolute 0.1 0.0 - 0.1 K/uL   Immature Granulocytes 1 %   Abs Immature Granulocytes 0.08 (H) 0.00 - 0.07 K/uL  Comprehensive metabolic panel  Result Value Ref Range   Sodium 137 135 - 145 mmol/L   Potassium 3.2 (L) 3.5 - 5.1 mmol/L   Chloride 98 98 - 111 mmol/L   CO2 26 22 - 32 mmol/L   Glucose, Bld 201 (H) 70 - 99 mg/dL   BUN 21 8 - 23 mg/dL   Creatinine, Ser 0.86 (H) 0.61 - 1.24 mg/dL   Calcium 9.4 8.9 - 57.8 mg/dL   Total Protein 7.7 6.5 - 8.1 g/dL   Albumin 4.5 3.5 - 5.0 g/dL   AST 23 15 - 41 U/L   ALT 20 0 - 44 U/L   Alkaline Phosphatase 60 38 - 126 U/L   Total Bilirubin 1.7 (H) 0.3 -  1.2 mg/dL   GFR calc non Af Amer 52 (L) >60 mL/min   GFR calc Af Amer 60 (L) >60 mL/min   Anion gap 13 5 - 15  Urinalysis, Complete w Microscopic  Result Value Ref Range   Color, Urine AMBER (A) YELLOW   APPearance HAZY (A) CLEAR   Specific Gravity, Urine 1.029 1.005 - 1.030   pH 5.0 5.0 - 8.0   Glucose, UA NEGATIVE NEGATIVE mg/dL   Hgb urine dipstick SMALL (A) NEGATIVE   Bilirubin Urine NEGATIVE NEGATIVE   Ketones, ur 20 (A) NEGATIVE mg/dL   Protein, ur 30 (A) NEGATIVE mg/dL   Nitrite NEGATIVE NEGATIVE   Leukocytes,Ua TRACE (A) NEGATIVE   RBC / HPF 0-5 0 - 5 RBC/hpf   WBC, UA 11-20 0 - 5 WBC/hpf   Bacteria, UA NONE SEEN NONE SEEN   Squamous Epithelial / LPF 0-5 0 - 5   Mucus PRESENT   Magnesium  Result Value Ref Range   Magnesium 1.9 1.7 - 2.4 mg/dL  Hemoglobin Z8H  Result Value Ref Range   Hgb A1c MFr Bld 6.4 (H) 4.8 - 5.6 %   Mean Plasma Glucose 136.98 mg/dL  Glucose, capillary  Result Value Ref Range   Glucose-Capillary 145 (H) 70 - 99 mg/dL  Glucose, capillary  Result Value Ref Range    Glucose-Capillary 157 (H) 70 - 99 mg/dL  CBC with Differential/Platelet  Result Value Ref Range   WBC 10.6 (H) 4.0 - 10.5 K/uL   RBC 4.78 4.22 - 5.81 MIL/uL   Hemoglobin 14.9 13.0 - 17.0 g/dL   HCT 88.5 02.7 - 74.1 %   MCV 92.1 80.0 - 100.0 fL   MCH 31.2 26.0 - 34.0 pg   MCHC 33.9 30.0 - 36.0 g/dL   RDW 28.7 86.7 - 67.2 %   Platelets 273 150 - 400 K/uL   nRBC 0.0 0.0 - 0.2 %   Neutrophils Relative % 85 %   Neutro Abs 8.9 (H) 1.7 - 7.7 K/uL   Lymphocytes Relative 7 %   Lymphs Abs 0.8 0.7 - 4.0 K/uL   Monocytes Relative 8 %   Monocytes Absolute 0.9 0.1 - 1.0 K/uL   Eosinophils Relative 0 %   Eosinophils Absolute 0.0 0.0 - 0.5 K/uL   Basophils Relative 0 %   Basophils Absolute 0.0 0.0 - 0.1 K/uL   Immature Granulocytes 0 %   Abs Immature Granulocytes 0.02 0.00 - 0.07 K/uL  Phosphorus  Result Value Ref Range   Phosphorus 2.3 (L) 2.5 - 4.6 mg/dL  Comprehensive metabolic panel  Result Value Ref Range   Sodium 137 135 - 145 mmol/L   Potassium 3.8 3.5 - 5.1 mmol/L   Chloride 102 98 - 111 mmol/L   CO2 25 22 - 32 mmol/L   Glucose, Bld 147 (H) 70 - 99 mg/dL   BUN 20 8 - 23 mg/dL   Creatinine, Ser 0.94 0.61 - 1.24 mg/dL   Calcium 8.9 8.9 - 70.9 mg/dL   Total Protein 7.2 6.5 - 8.1 g/dL   Albumin 4.1 3.5 - 5.0 g/dL   AST 21 15 - 41 U/L   ALT 19 0 - 44 U/L   Alkaline Phosphatase 57 38 - 126 U/L   Total Bilirubin 1.9 (H) 0.3 - 1.2 mg/dL   GFR calc non Af Amer >60 >60 mL/min   GFR calc Af Amer >60 >60 mL/min   Anion gap 10 5 - 15  Glucose, capillary  Result Value Ref Range  Glucose-Capillary 168 (H) 70 - 99 mg/dL  CBC with Differential/Platelet  Result Value Ref Range   WBC 8.1 4.0 - 10.5 K/uL   RBC 4.57 4.22 - 5.81 MIL/uL   Hemoglobin 14.5 13.0 - 17.0 g/dL   HCT 01.6 01.0 - 93.2 %   MCV 92.3 80.0 - 100.0 fL   MCH 31.7 26.0 - 34.0 pg   MCHC 34.4 30.0 - 36.0 g/dL   RDW 35.5 73.2 - 20.2 %   Platelets 261 150 - 400 K/uL   nRBC 0.0 0.0 - 0.2 %   Neutrophils Relative % 69  %   Neutro Abs 5.6 1.7 - 7.7 K/uL   Lymphocytes Relative 18 %   Lymphs Abs 1.4 0.7 - 4.0 K/uL   Monocytes Relative 12 %   Monocytes Absolute 1.0 0.1 - 1.0 K/uL   Eosinophils Relative 1 %   Eosinophils Absolute 0.1 0.0 - 0.5 K/uL   Basophils Relative 0 %   Basophils Absolute 0.0 0.0 - 0.1 K/uL   Immature Granulocytes 0 %   Abs Immature Granulocytes 0.02 0.00 - 0.07 K/uL  Comprehensive metabolic panel  Result Value Ref Range   Sodium 139 135 - 145 mmol/L   Potassium 3.5 3.5 - 5.1 mmol/L   Chloride 106 98 - 111 mmol/L   CO2 24 22 - 32 mmol/L   Glucose, Bld 104 (H) 70 - 99 mg/dL   BUN 17 8 - 23 mg/dL   Creatinine, Ser 5.42 0.61 - 1.24 mg/dL   Calcium 9.0 8.9 - 70.6 mg/dL   Total Protein 6.7 6.5 - 8.1 g/dL   Albumin 3.8 3.5 - 5.0 g/dL   AST 18 15 - 41 U/L   ALT 16 0 - 44 U/L   Alkaline Phosphatase 58 38 - 126 U/L   Total Bilirubin 1.7 (H) 0.3 - 1.2 mg/dL   GFR calc non Af Amer >60 >60 mL/min   GFR calc Af Amer >60 >60 mL/min   Anion gap 9 5 - 15  Magnesium  Result Value Ref Range   Magnesium 2.0 1.7 - 2.4 mg/dL  Phosphorus  Result Value Ref Range   Phosphorus 2.4 (L) 2.5 - 4.6 mg/dL  TSH  Result Value Ref Range   TSH 2.378 0.350 - 4.500 uIU/mL  T4, free  Result Value Ref Range   Free T4 1.09 0.61 - 1.12 ng/dL  Glucose, capillary  Result Value Ref Range   Glucose-Capillary 136 (H) 70 - 99 mg/dL  Glucose, capillary  Result Value Ref Range   Glucose-Capillary 126 (H) 70 - 99 mg/dL  Glucose, capillary  Result Value Ref Range   Glucose-Capillary 115 (H) 70 - 99 mg/dL   Comment 1 Notify RN   Glucose, capillary  Result Value Ref Range   Glucose-Capillary 135 (H) 70 - 99 mg/dL   Comment 1 Notify RN   ECHOCARDIOGRAM COMPLETE  Result Value Ref Range   Weight 3,520 oz   Height 76 in   BP 127/80 mmHg   Ao pk vel 1.80 m/s   AR max vel 2.30 cm2   AV Peak grad 13.0 mmHg   S' Lateral 3.54 cm   Area-P 1/2 2.74 cm2  Troponin I (High Sensitivity)  Result Value Ref Range    Troponin I (High Sensitivity) 24 (H) <18 ng/L  Troponin I (High Sensitivity)  Result Value Ref Range   Troponin I (High Sensitivity) 30 (H) <18 ng/L      Assessment & Plan:   Problem List Items Addressed  This Visit   None Visit Diagnoses     Encounter to establish care    -  Primary        Follow up plan: No follow-ups on file.

## 2021-02-11 NOTE — Progress Notes (Deleted)
There were no vitals taken for this visit.   Subjective:    Patient ID: Juan Shelton, male    DOB: 1951-01-04, 71 y.o.   MRN: [id number]  HPI: Juan Shelton is a 71 y.o. male  No chief complaint on file.  Patient presents to clinic to establish care with new PCP.  Introduced to Publishing rights manager role and practice setting.  All questions answered.  Discussed provider/patient relationship and expectations.  Patient reports a history of ***. Patient denies a history of: Hypertension, Elevated Cholesterol, Diabetes, Thyroid problems, Depression, Anxiety, Neurological problems, and Abdominal problems.   Active Ambulatory Problems    Diagnosis Date Noted   Syncope 10/11/2019   Syncope and collapse 10/12/2019   Resolved Ambulatory Problems    Diagnosis Date Noted   No Resolved Ambulatory Problems   Past Medical History:  Diagnosis Date   Hypertension    No past surgical history on file. No family history on file.   Review of Systems  Per HPI unless specifically indicated above     Objective:    There were no vitals taken for this visit.  Wt Readings from Last 3 Encounters:  10/22/19 220 lb (99.8 kg)  10/11/19 220 lb (99.8 kg)    Physical Exam  Results for orders placed or performed during the hospital encounter of 10/11/19  Respiratory Panel by RT PCR (Flu A&B, Covid) - Nasopharyngeal Swab   Specimen: Nasopharyngeal Swab  Result Value Ref Range   SARS Coronavirus 2 by RT PCR NEGATIVE NEGATIVE   Influenza A by PCR NEGATIVE NEGATIVE   Influenza B by PCR NEGATIVE NEGATIVE  CBC with Differential  Result Value Ref Range   WBC 15.3 (H) 4.0 - 10.5 K/uL   RBC 5.14 4.22 - 5.81 MIL/uL   Hemoglobin 15.9 13.0 - 17.0 g/dL   HCT 75.6 43.3 - 29.5 %   MCV 93.0 80.0 - 100.0 fL   MCH 30.9 26.0 - 34.0 pg   MCHC 33.3 30.0 - 36.0 g/dL   RDW 18.8 41.6 - 60.6 %   Platelets 298 150 - 400 K/uL   nRBC 0.0 0.0 - 0.2 %   Neutrophils Relative % 82 %   Neutro Abs 12.7 (H) 1.7 - 7.7 K/uL    Lymphocytes Relative 9 %   Lymphs Abs 1.3 0.7 - 4.0 K/uL   Monocytes Relative 8 %   Monocytes Absolute 1.2 (H) 0.1 - 1.0 K/uL   Eosinophils Relative 0 %   Eosinophils Absolute 0.0 0.0 - 0.5 K/uL   Basophils Relative 0 %   Basophils Absolute 0.1 0.0 - 0.1 K/uL   Immature Granulocytes 1 %   Abs Immature Granulocytes 0.08 (H) 0.00 - 0.07 K/uL  Comprehensive metabolic panel  Result Value Ref Range   Sodium 137 135 - 145 mmol/L   Potassium 3.2 (L) 3.5 - 5.1 mmol/L   Chloride 98 98 - 111 mmol/L   CO2 26 22 - 32 mmol/L   Glucose, Bld 201 (H) 70 - 99 mg/dL   BUN 21 8 - 23 mg/dL   Creatinine, Ser 3.01 (H) 0.61 - 1.24 mg/dL   Calcium 9.4 8.9 - 60.1 mg/dL   Total Protein 7.7 6.5 - 8.1 g/dL   Albumin 4.5 3.5 - 5.0 g/dL   AST 23 15 - 41 U/L   ALT 20 0 - 44 U/L   Alkaline Phosphatase 60 38 - 126 U/L   Total Bilirubin 1.7 (H) 0.3 - 1.2 mg/dL   GFR calc non  Af Amer 52 (L) >60 mL/min   GFR calc Af Amer 60 (L) >60 mL/min   Anion gap 13 5 - 15  Urinalysis, Complete w Microscopic  Result Value Ref Range   Color, Urine AMBER (A) YELLOW   APPearance HAZY (A) CLEAR   Specific Gravity, Urine 1.029 1.005 - 1.030   pH 5.0 5.0 - 8.0   Glucose, UA NEGATIVE NEGATIVE mg/dL   Hgb urine dipstick SMALL (A) NEGATIVE   Bilirubin Urine NEGATIVE NEGATIVE   Ketones, ur 20 (A) NEGATIVE mg/dL   Protein, ur 30 (A) NEGATIVE mg/dL   Nitrite NEGATIVE NEGATIVE   Leukocytes,Ua TRACE (A) NEGATIVE   RBC / HPF 0-5 0 - 5 RBC/hpf   WBC, UA 11-20 0 - 5 WBC/hpf   Bacteria, UA NONE SEEN NONE SEEN   Squamous Epithelial / LPF 0-5 0 - 5   Mucus PRESENT   Magnesium  Result Value Ref Range   Magnesium 1.9 1.7 - 2.4 mg/dL  Hemoglobin Q4OA1c  Result Value Ref Range   Hgb A1c MFr Bld 6.4 (H) 4.8 - 5.6 %   Mean Plasma Glucose 136.98 mg/dL  Glucose, capillary  Result Value Ref Range   Glucose-Capillary 145 (H) 70 - 99 mg/dL  Glucose, capillary  Result Value Ref Range   Glucose-Capillary 157 (H) 70 - 99 mg/dL  CBC with  Differential/Platelet  Result Value Ref Range   WBC 10.6 (H) 4.0 - 10.5 K/uL   RBC 4.78 4.22 - 5.81 MIL/uL   Hemoglobin 14.9 13.0 - 17.0 g/dL   HCT 96.244.0 95.239.0 - 84.152.0 %   MCV 92.1 80.0 - 100.0 fL   MCH 31.2 26.0 - 34.0 pg   MCHC 33.9 30.0 - 36.0 g/dL   RDW 32.413.9 40.111.5 - 02.715.5 %   Platelets 273 150 - 400 K/uL   nRBC 0.0 0.0 - 0.2 %   Neutrophils Relative % 85 %   Neutro Abs 8.9 (H) 1.7 - 7.7 K/uL   Lymphocytes Relative 7 %   Lymphs Abs 0.8 0.7 - 4.0 K/uL   Monocytes Relative 8 %   Monocytes Absolute 0.9 0.1 - 1.0 K/uL   Eosinophils Relative 0 %   Eosinophils Absolute 0.0 0.0 - 0.5 K/uL   Basophils Relative 0 %   Basophils Absolute 0.0 0.0 - 0.1 K/uL   Immature Granulocytes 0 %   Abs Immature Granulocytes 0.02 0.00 - 0.07 K/uL  Phosphorus  Result Value Ref Range   Phosphorus 2.3 (L) 2.5 - 4.6 mg/dL  Comprehensive metabolic panel  Result Value Ref Range   Sodium 137 135 - 145 mmol/L   Potassium 3.8 3.5 - 5.1 mmol/L   Chloride 102 98 - 111 mmol/L   CO2 25 22 - 32 mmol/L   Glucose, Bld 147 (H) 70 - 99 mg/dL   BUN 20 8 - 23 mg/dL   Creatinine, Ser 2.531.04 0.61 - 1.24 mg/dL   Calcium 8.9 8.9 - 66.410.3 mg/dL   Total Protein 7.2 6.5 - 8.1 g/dL   Albumin 4.1 3.5 - 5.0 g/dL   AST 21 15 - 41 U/L   ALT 19 0 - 44 U/L   Alkaline Phosphatase 57 38 - 126 U/L   Total Bilirubin 1.9 (H) 0.3 - 1.2 mg/dL   GFR calc non Af Amer >60 >60 mL/min   GFR calc Af Amer >60 >60 mL/min   Anion gap 10 5 - 15  Glucose, capillary  Result Value Ref Range   Glucose-Capillary 168 (H) 70 - 99  mg/dL  CBC with Differential/Platelet  Result Value Ref Range   WBC 8.1 4.0 - 10.5 K/uL   RBC 4.57 4.22 - 5.81 MIL/uL   Hemoglobin 14.5 13.0 - 17.0 g/dL   HCT 29.0 21.1 - 15.5 %   MCV 92.3 80.0 - 100.0 fL   MCH 31.7 26.0 - 34.0 pg   MCHC 34.4 30.0 - 36.0 g/dL   RDW 20.8 02.2 - 33.6 %   Platelets 261 150 - 400 K/uL   nRBC 0.0 0.0 - 0.2 %   Neutrophils Relative % 69 %   Neutro Abs 5.6 1.7 - 7.7 K/uL   Lymphocytes  Relative 18 %   Lymphs Abs 1.4 0.7 - 4.0 K/uL   Monocytes Relative 12 %   Monocytes Absolute 1.0 0.1 - 1.0 K/uL   Eosinophils Relative 1 %   Eosinophils Absolute 0.1 0.0 - 0.5 K/uL   Basophils Relative 0 %   Basophils Absolute 0.0 0.0 - 0.1 K/uL   Immature Granulocytes 0 %   Abs Immature Granulocytes 0.02 0.00 - 0.07 K/uL  Comprehensive metabolic panel  Result Value Ref Range   Sodium 139 135 - 145 mmol/L   Potassium 3.5 3.5 - 5.1 mmol/L   Chloride 106 98 - 111 mmol/L   CO2 24 22 - 32 mmol/L   Glucose, Bld 104 (H) 70 - 99 mg/dL   BUN 17 8 - 23 mg/dL   Creatinine, Ser 1.22 0.61 - 1.24 mg/dL   Calcium 9.0 8.9 - 44.9 mg/dL   Total Protein 6.7 6.5 - 8.1 g/dL   Albumin 3.8 3.5 - 5.0 g/dL   AST 18 15 - 41 U/L   ALT 16 0 - 44 U/L   Alkaline Phosphatase 58 38 - 126 U/L   Total Bilirubin 1.7 (H) 0.3 - 1.2 mg/dL   GFR calc non Af Amer >60 >60 mL/min   GFR calc Af Amer >60 >60 mL/min   Anion gap 9 5 - 15  Magnesium  Result Value Ref Range   Magnesium 2.0 1.7 - 2.4 mg/dL  Phosphorus  Result Value Ref Range   Phosphorus 2.4 (L) 2.5 - 4.6 mg/dL  TSH  Result Value Ref Range   TSH 2.378 0.350 - 4.500 uIU/mL  T4, free  Result Value Ref Range   Free T4 1.09 0.61 - 1.12 ng/dL  Glucose, capillary  Result Value Ref Range   Glucose-Capillary 136 (H) 70 - 99 mg/dL  Glucose, capillary  Result Value Ref Range   Glucose-Capillary 126 (H) 70 - 99 mg/dL  Glucose, capillary  Result Value Ref Range   Glucose-Capillary 115 (H) 70 - 99 mg/dL   Comment 1 Notify RN   Glucose, capillary  Result Value Ref Range   Glucose-Capillary 135 (H) 70 - 99 mg/dL   Comment 1 Notify RN   ECHOCARDIOGRAM COMPLETE  Result Value Ref Range   Weight 3,520 oz   Height 76 in   BP 127/80 mmHg   Ao pk vel 1.80 m/s   AR max vel 2.30 cm2   AV Peak grad 13.0 mmHg   S' Lateral 3.54 cm   Area-P 1/2 2.74 cm2  Troponin I (High Sensitivity)  Result Value Ref Range   Troponin I (High Sensitivity) 24 (H) <18 ng/L   Troponin I (High Sensitivity)  Result Value Ref Range   Troponin I (High Sensitivity) 30 (H) <18 ng/L      Assessment & Plan:   Problem List Items Addressed This Visit   None  Follow up plan: No follow-ups on file.

## 2021-02-12 ENCOUNTER — Ambulatory Visit: Payer: Self-pay | Admitting: Nurse Practitioner

## 2021-05-18 IMAGING — CT CT CERVICAL SPINE W/O CM
3 of 4 series · 10 of 33 positions shown, 12 images · non-contrast
Comparison: None.

CLINICAL DATA: Pain following fall

EXAM:
CT CERVICAL SPINE WITHOUT CONTRAST
TECHNIQUE: Multidetector CT imaging of the cervical spine was performed without
intravenous contrast. Multiplanar CT image reconstructions were also
generated.

[Series 6: sagittal bone · sagittal · 0.27mm/px · 5 of 50 slices shown, 6 images]
[im 17/50  bone]
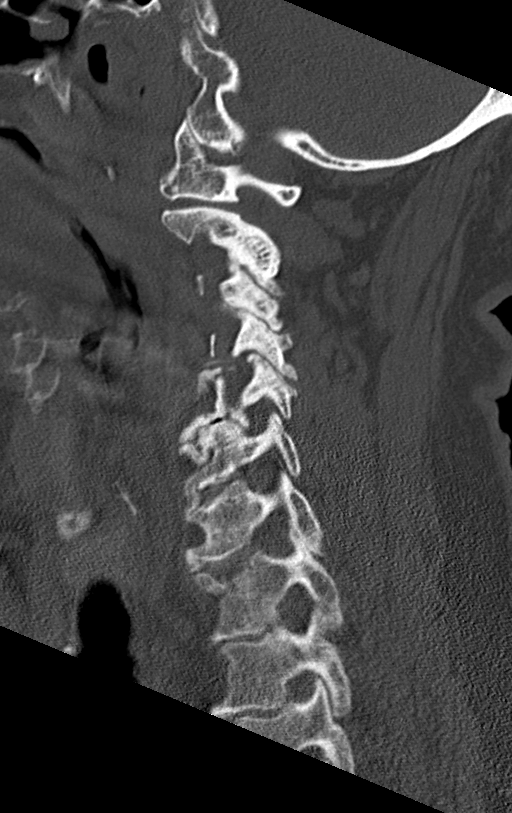
[im 21/50  bone]
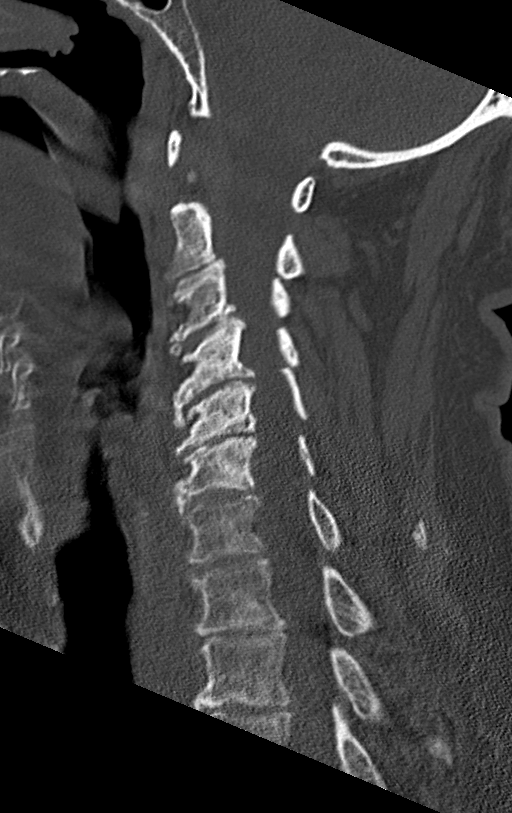
[im 25/50  soft-tissue]
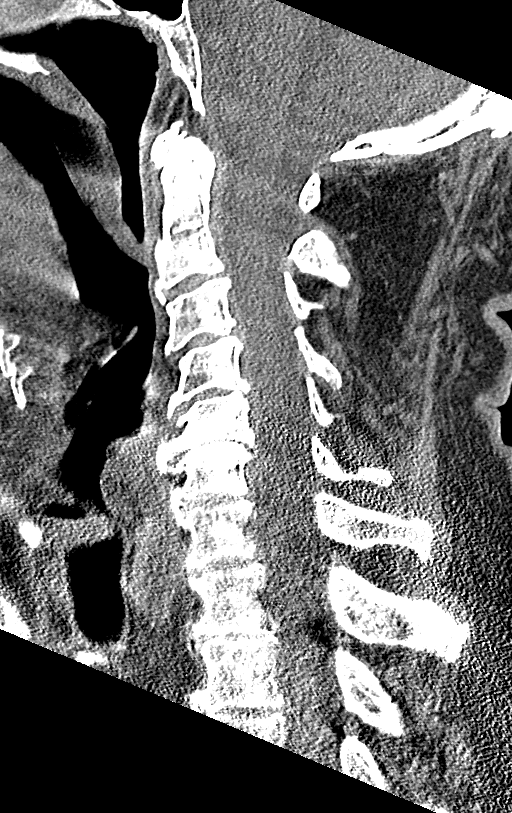
[im 25/50  bone]
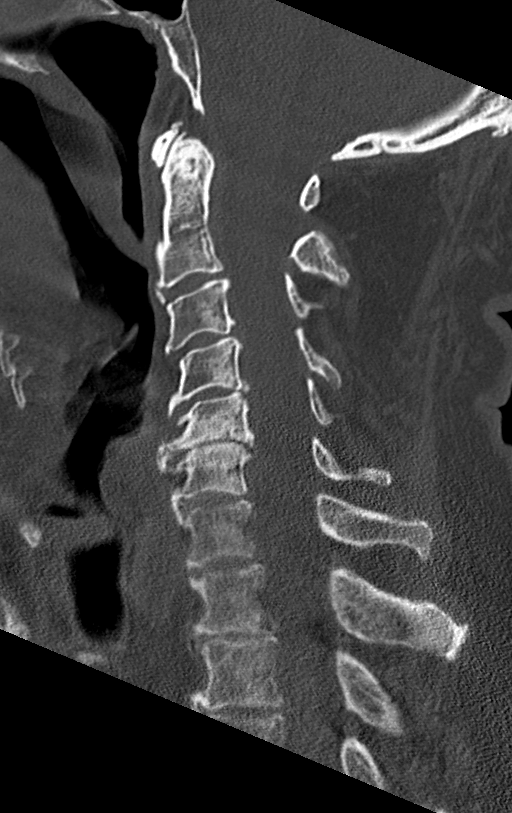
[im 29/50  bone]
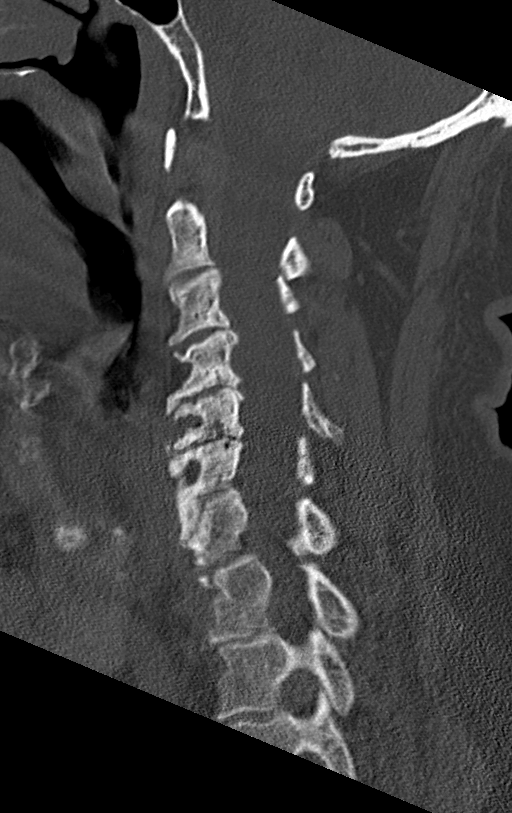
[im 33/50  bone]
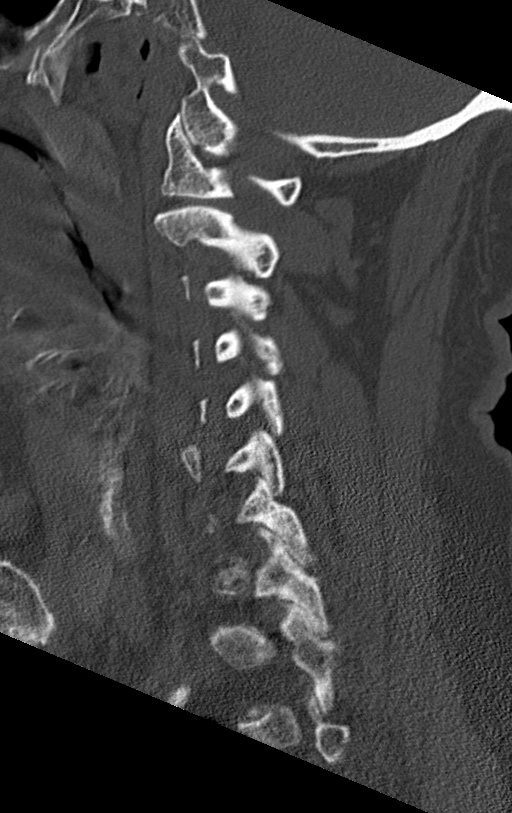

[Series 7: coronal bone · coronal · 0.19mm/px · 3 of 55 slices shown]
[im 11/55  bone]
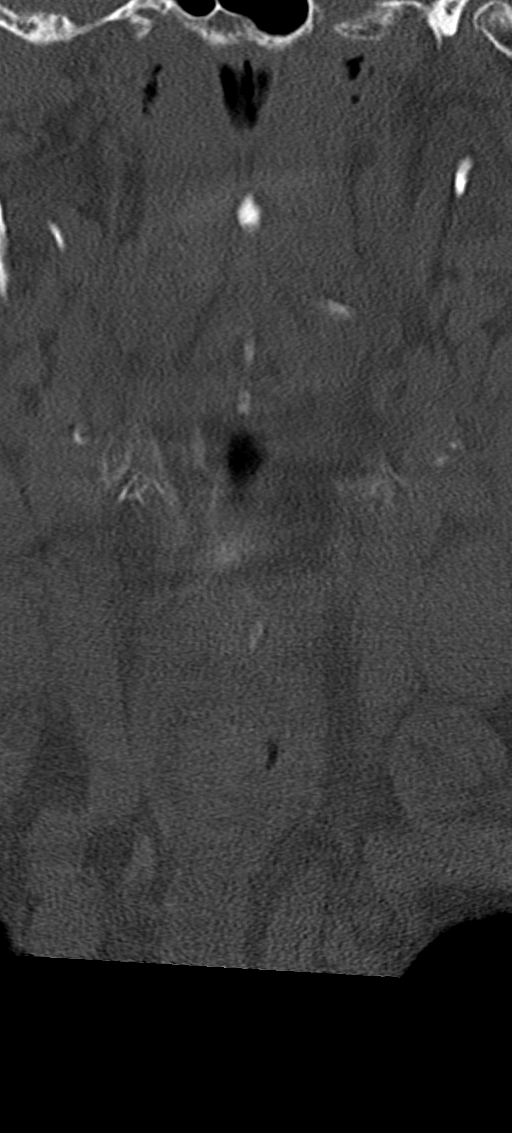
[im 22/55  bone]
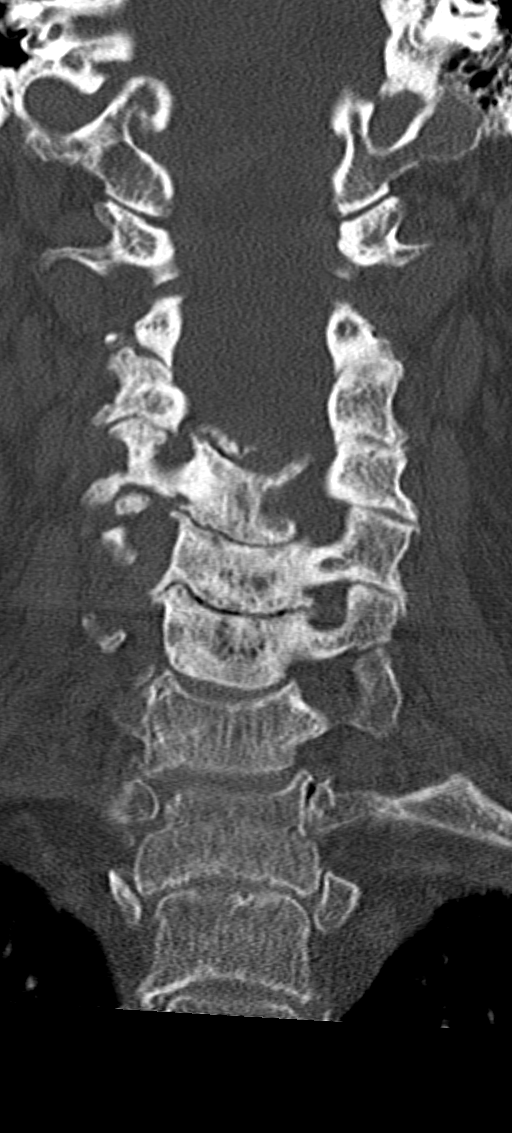
[im 33/55  bone]
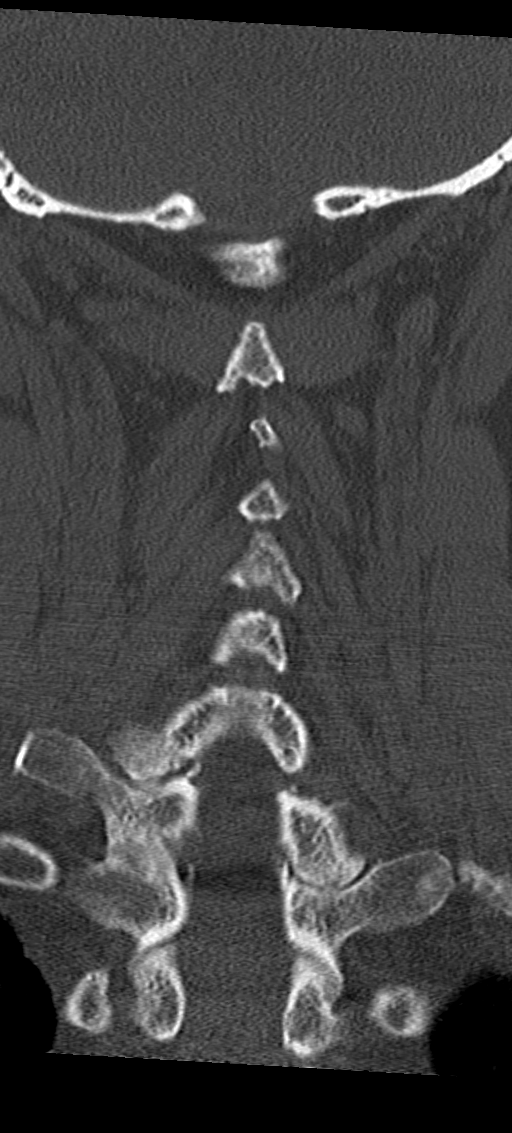

[Series 8: orthogonal bone · axial · 0.22mm/px · z∈[-256,-171]mm · 2 of 110 slices shown, 3 images]
[im 32/110  soft-tissue]
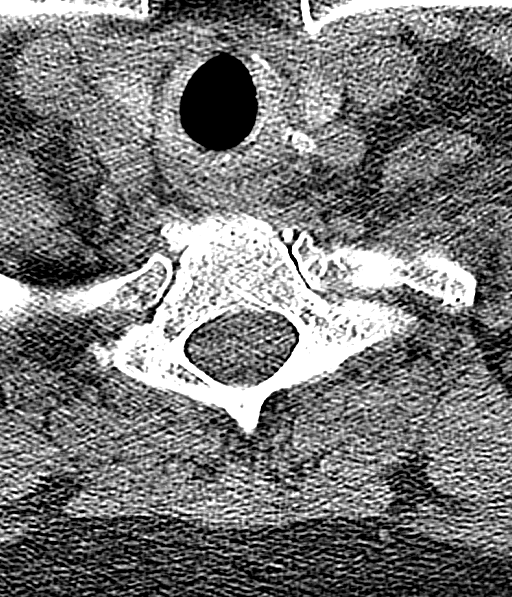
[im 32/110  bone]
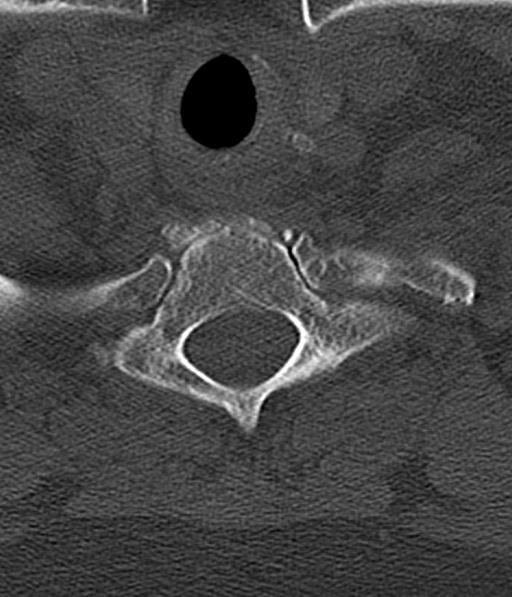
[im 78/110  bone]
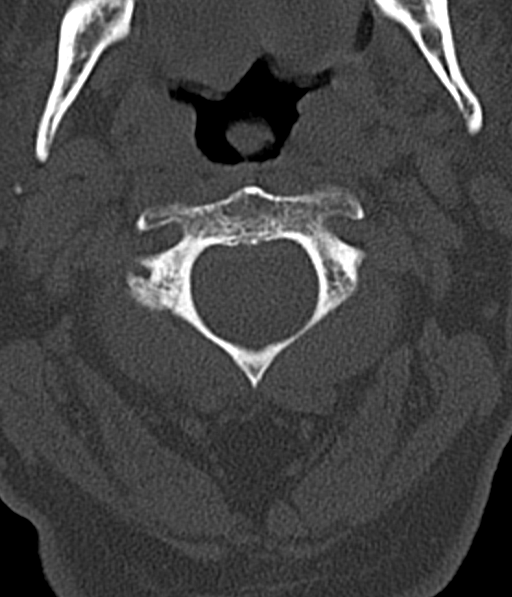

[10 of 33 positions shown; findings below may reference images not displayed]

FINDINGS: Alignment: There is no appreciable spondylolisthesis.

Skull base and vertebrae: Skull base and craniocervical junction
regions appear normal. No fracture evident. No blastic or lytic bone
lesions.

Soft tissues and spinal canal: Prevertebral soft tissues and
predental space regions are normal. There is no appreciable cord or
canal hematoma. No paraspinous lesions are evident.

Disc levels: There is severe disc space narrowing at C5-6. There is
mild to moderate disc space narrowing at C4-5 and C6-7. There are
anterior osteophytes at C3, C4, C5, and C6. There is multilevel
facet hypertrophy. Facet hypertrophy is severe on the right at C3-4
with impression on the exiting nerve root due to bony hypertrophy.
Similar changes are noted on the right at C4-5. No frank disc
extrusion or high-grade stenosis evident.

Upper chest: Visualized upper lung regions are clear.

Other: There are foci of bilateral carotid artery and left
subclavian artery calcification.
IMPRESSION: No fracture or spondylolisthesis. Multilevel arthropathy. Impression
on exiting nerve roots on the right C3-4 and C4-5 due to bony
hypertrophy. Multiple foci of facet arthropathy noted. No frank disc
extrusion or high-grade stenosis.

There are foci of arterial vascular calcification at several sites.
